# Patient Record
Sex: Female | Born: 1950 | Race: White | Hispanic: No | Smoking: Never smoker
Health system: Southern US, Community
[De-identification: ages and names within clinical notes are randomized; demographics above are authoritative.]

## PROBLEM LIST (undated history)

## (undated) DIAGNOSIS — M25561 Pain in right knee: Secondary | ICD-10-CM

## (undated) DIAGNOSIS — F329 Major depressive disorder, single episode, unspecified: Secondary | ICD-10-CM

## (undated) DIAGNOSIS — E669 Obesity, unspecified: Secondary | ICD-10-CM

## (undated) DIAGNOSIS — F32A Depression, unspecified: Secondary | ICD-10-CM

## (undated) DIAGNOSIS — M25571 Pain in right ankle and joints of right foot: Secondary | ICD-10-CM

## (undated) DIAGNOSIS — I10 Essential (primary) hypertension: Secondary | ICD-10-CM

## (undated) DIAGNOSIS — M1710 Unilateral primary osteoarthritis, unspecified knee: Secondary | ICD-10-CM

## (undated) DIAGNOSIS — N83209 Unspecified ovarian cyst, unspecified side: Secondary | ICD-10-CM

## (undated) HISTORY — DX: Pain in right ankle and joints of right foot: M25.571

## (undated) HISTORY — PX: ABDOMINAL HYSTERECTOMY: SHX81

## (undated) HISTORY — PX: FOREARM SURGERY: SHX651

## (undated) HISTORY — DX: Pain in right knee: M25.561

## (undated) HISTORY — PX: PARTIAL NEPHRECTOMY: SHX414

## (undated) HISTORY — PX: BLADDER REPAIR: SHX76

## (undated) HISTORY — DX: Unspecified ovarian cyst, unspecified side: N83.209

## (undated) HISTORY — PX: DILATION AND CURETTAGE OF UTERUS: SHX78

---

## 1988-11-11 HISTORY — PX: OTHER SURGICAL HISTORY: SHX169

## 2011-09-25 ENCOUNTER — Encounter: Payer: Self-pay | Admitting: *Deleted

## 2011-09-25 ENCOUNTER — Emergency Department (HOSPITAL_COMMUNITY): Payer: No Typology Code available for payment source

## 2011-09-25 ENCOUNTER — Emergency Department (HOSPITAL_COMMUNITY)
Admission: EM | Admit: 2011-09-25 | Discharge: 2011-09-25 | Disposition: A | Discharge status: Home / Self Care | Payer: No Typology Code available for payment source | Attending: Emergency Medicine | Admitting: Emergency Medicine

## 2011-09-25 DIAGNOSIS — M25519 Pain in unspecified shoulder: Secondary | ICD-10-CM | POA: Insufficient documentation

## 2011-09-25 DIAGNOSIS — M7731 Calcaneal spur, right foot: Secondary | ICD-10-CM

## 2011-09-25 DIAGNOSIS — Z7982 Long term (current) use of aspirin: Secondary | ICD-10-CM | POA: Insufficient documentation

## 2011-09-25 DIAGNOSIS — Z79899 Other long term (current) drug therapy: Secondary | ICD-10-CM | POA: Insufficient documentation

## 2011-09-25 DIAGNOSIS — M25473 Effusion, unspecified ankle: Secondary | ICD-10-CM | POA: Insufficient documentation

## 2011-09-25 DIAGNOSIS — M19019 Primary osteoarthritis, unspecified shoulder: Secondary | ICD-10-CM

## 2011-09-25 DIAGNOSIS — F3289 Other specified depressive episodes: Secondary | ICD-10-CM | POA: Insufficient documentation

## 2011-09-25 DIAGNOSIS — M25571 Pain in right ankle and joints of right foot: Secondary | ICD-10-CM

## 2011-09-25 DIAGNOSIS — M25579 Pain in unspecified ankle and joints of unspecified foot: Secondary | ICD-10-CM | POA: Insufficient documentation

## 2011-09-25 DIAGNOSIS — F329 Major depressive disorder, single episode, unspecified: Secondary | ICD-10-CM | POA: Insufficient documentation

## 2011-09-25 DIAGNOSIS — M25476 Effusion, unspecified foot: Secondary | ICD-10-CM | POA: Insufficient documentation

## 2011-09-25 DIAGNOSIS — M7730 Calcaneal spur, unspecified foot: Secondary | ICD-10-CM | POA: Insufficient documentation

## 2011-09-25 DIAGNOSIS — I10 Essential (primary) hypertension: Secondary | ICD-10-CM | POA: Insufficient documentation

## 2011-09-25 HISTORY — DX: Depression, unspecified: F32.A

## 2011-09-25 HISTORY — DX: Essential (primary) hypertension: I10

## 2011-09-25 HISTORY — DX: Obesity, unspecified: E66.9

## 2011-09-25 HISTORY — DX: Major depressive disorder, single episode, unspecified: F32.9

## 2011-09-25 LAB — URINALYSIS, ROUTINE W REFLEX MICROSCOPIC
Glucose, UA: NEGATIVE mg/dL
Leukocytes, UA: NEGATIVE
Specific Gravity, Urine: 1.007 (ref 1.005–1.030)
pH: 7 (ref 5.0–8.0)

## 2011-09-25 LAB — GLUCOSE, CAPILLARY: Glucose-Capillary: 73 mg/dL (ref 70–99)

## 2011-09-25 MED ORDER — HYDROCODONE-ACETAMINOPHEN 5-325 MG PO TABS
1.0000 | ORAL_TABLET | Freq: Once | ORAL | Status: AC
  Administered 2011-09-25: 1 via ORAL
  Filled 2011-09-25: qty 1

## 2011-09-25 MED ORDER — HYDROCODONE-ACETAMINOPHEN 5-325 MG PO TABS: 1.0000 | ORAL_TABLET | Freq: Four times a day (QID) | ORAL | Status: AC | PRN

## 2011-09-25 NOTE — ED Notes (Signed)
Paged ortho to apply cam walker

## 2011-09-25 NOTE — ED Notes (Signed)
When asked if she had a ride home, she said no but that they are on their way here. Pt also asked that she be checked for diabetes and uti.  Paige Dredge, PA made aware of pt's request.

## 2011-09-25 NOTE — ED Provider Notes (Signed)
4:00 PM Patient reports pain in right shoulder and requests pain medication.  I discussed results with her.  Per discussion with Ebbie Ridge, PA-C, plan is for CAM walker for ankle and ortho and PCP follow up.    Patient reports she has been nervous and thinks that is why she is urinating frequently.   Requests cbg and UA.  I have discussed these results with her.  Dillard Cannon [place], [place] 09/25/11 2116

## 2011-09-25 NOTE — Progress Notes (Signed)
Orthopedic Tech Progress Note Patient Details:  Paige Jenkins 23-Jan-1951 [id number]  Other Ortho Devices Type of Ortho Device: CAM walker Ortho Device Location: right foot Ortho Device Interventions: Application   Nikki Dom 09/25/2011, 5:07 PM
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
## 2011-09-25 NOTE — ED Notes (Signed)
Having pain to right shoulder and right ankle, denies injury.

## 2011-09-26 NOTE — ED Provider Notes (Signed)
Medical screening examination/treatment/procedure(s) were performed by non-physician practitioner and as supervising physician I was immediately available for consultation/collaboration.  Doug Sou, MD 09/26/11 1119

## 2011-10-08 ENCOUNTER — Ambulatory Visit: Payer: No Typology Code available for payment source | Attending: Orthopedic Surgery | Admitting: Physical Therapy

## 2011-10-08 DIAGNOSIS — M25519 Pain in unspecified shoulder: Secondary | ICD-10-CM | POA: Insufficient documentation

## 2011-10-08 DIAGNOSIS — M25579 Pain in unspecified ankle and joints of unspecified foot: Secondary | ICD-10-CM | POA: Insufficient documentation

## 2011-10-08 DIAGNOSIS — IMO0001 Reserved for inherently not codable concepts without codable children: Secondary | ICD-10-CM | POA: Insufficient documentation

## 2011-10-08 DIAGNOSIS — M25676 Stiffness of unspecified foot, not elsewhere classified: Secondary | ICD-10-CM | POA: Insufficient documentation

## 2011-10-08 DIAGNOSIS — M25673 Stiffness of unspecified ankle, not elsewhere classified: Secondary | ICD-10-CM | POA: Insufficient documentation

## 2011-10-08 DIAGNOSIS — M25619 Stiffness of unspecified shoulder, not elsewhere classified: Secondary | ICD-10-CM | POA: Insufficient documentation

## 2011-10-10 ENCOUNTER — Ambulatory Visit: Payer: No Typology Code available for payment source | Admitting: Physical Therapy

## 2011-10-15 ENCOUNTER — Ambulatory Visit: Payer: No Typology Code available for payment source | Attending: Orthopedic Surgery | Admitting: Physical Therapy

## 2011-10-15 DIAGNOSIS — M25676 Stiffness of unspecified foot, not elsewhere classified: Secondary | ICD-10-CM | POA: Insufficient documentation

## 2011-10-15 DIAGNOSIS — M25579 Pain in unspecified ankle and joints of unspecified foot: Secondary | ICD-10-CM | POA: Insufficient documentation

## 2011-10-15 DIAGNOSIS — M25519 Pain in unspecified shoulder: Secondary | ICD-10-CM | POA: Insufficient documentation

## 2011-10-15 DIAGNOSIS — IMO0001 Reserved for inherently not codable concepts without codable children: Secondary | ICD-10-CM | POA: Insufficient documentation

## 2011-10-15 DIAGNOSIS — M25673 Stiffness of unspecified ankle, not elsewhere classified: Secondary | ICD-10-CM | POA: Insufficient documentation

## 2011-10-15 DIAGNOSIS — M25619 Stiffness of unspecified shoulder, not elsewhere classified: Secondary | ICD-10-CM | POA: Insufficient documentation

## 2011-10-17 ENCOUNTER — Ambulatory Visit: Payer: No Typology Code available for payment source | Admitting: Physical Therapy

## 2011-10-22 ENCOUNTER — Ambulatory Visit: Payer: No Typology Code available for payment source | Admitting: Physical Therapy

## 2011-10-24 ENCOUNTER — Ambulatory Visit: Payer: No Typology Code available for payment source | Admitting: Physical Therapy

## 2011-10-29 ENCOUNTER — Ambulatory Visit: Payer: No Typology Code available for payment source | Admitting: Physical Therapy

## 2011-10-31 ENCOUNTER — Ambulatory Visit: Payer: No Typology Code available for payment source | Admitting: Physical Therapy

## 2011-11-07 ENCOUNTER — Ambulatory Visit: Payer: No Typology Code available for payment source | Admitting: Physical Therapy

## 2011-11-08 ENCOUNTER — Ambulatory Visit: Payer: No Typology Code available for payment source | Admitting: Physical Therapy

## 2011-11-13 ENCOUNTER — Ambulatory Visit: Payer: No Typology Code available for payment source | Attending: Orthopedic Surgery | Admitting: Physical Therapy

## 2011-11-13 DIAGNOSIS — M25519 Pain in unspecified shoulder: Secondary | ICD-10-CM | POA: Insufficient documentation

## 2011-11-13 DIAGNOSIS — M25619 Stiffness of unspecified shoulder, not elsewhere classified: Secondary | ICD-10-CM | POA: Insufficient documentation

## 2011-11-13 DIAGNOSIS — M25673 Stiffness of unspecified ankle, not elsewhere classified: Secondary | ICD-10-CM | POA: Insufficient documentation

## 2011-11-13 DIAGNOSIS — IMO0001 Reserved for inherently not codable concepts without codable children: Secondary | ICD-10-CM | POA: Insufficient documentation

## 2011-11-13 DIAGNOSIS — M25676 Stiffness of unspecified foot, not elsewhere classified: Secondary | ICD-10-CM | POA: Insufficient documentation

## 2011-11-13 DIAGNOSIS — M25579 Pain in unspecified ankle and joints of unspecified foot: Secondary | ICD-10-CM | POA: Insufficient documentation

## 2011-11-15 ENCOUNTER — Ambulatory Visit: Payer: No Typology Code available for payment source | Admitting: Physical Therapy

## 2011-11-19 ENCOUNTER — Ambulatory Visit: Payer: No Typology Code available for payment source | Admitting: Physical Therapy

## 2011-11-21 ENCOUNTER — Ambulatory Visit: Payer: No Typology Code available for payment source | Admitting: Physical Therapy

## 2011-11-22 ENCOUNTER — Ambulatory Visit: Payer: No Typology Code available for payment source | Admitting: Physical Therapy

## 2011-11-26 ENCOUNTER — Ambulatory Visit: Payer: No Typology Code available for payment source | Admitting: Physical Therapy

## 2011-11-28 ENCOUNTER — Ambulatory Visit: Payer: No Typology Code available for payment source | Admitting: Physical Therapy

## 2011-12-03 ENCOUNTER — Ambulatory Visit: Payer: No Typology Code available for payment source | Admitting: Physical Therapy

## 2011-12-04 ENCOUNTER — Ambulatory Visit: Payer: No Typology Code available for payment source | Admitting: Physical Therapy

## 2011-12-05 ENCOUNTER — Ambulatory Visit: Payer: No Typology Code available for payment source | Admitting: Physical Therapy

## 2011-12-06 ENCOUNTER — Ambulatory Visit: Payer: No Typology Code available for payment source | Admitting: Physical Therapy

## 2011-12-10 ENCOUNTER — Ambulatory Visit: Payer: No Typology Code available for payment source | Admitting: Physical Therapy

## 2011-12-12 ENCOUNTER — Ambulatory Visit: Payer: No Typology Code available for payment source | Admitting: Physical Therapy

## 2011-12-13 ENCOUNTER — Ambulatory Visit: Payer: No Typology Code available for payment source | Attending: Orthopedic Surgery | Admitting: Physical Therapy

## 2011-12-13 DIAGNOSIS — IMO0001 Reserved for inherently not codable concepts without codable children: Secondary | ICD-10-CM | POA: Insufficient documentation

## 2011-12-13 DIAGNOSIS — M25579 Pain in unspecified ankle and joints of unspecified foot: Secondary | ICD-10-CM | POA: Insufficient documentation

## 2011-12-13 DIAGNOSIS — M25676 Stiffness of unspecified foot, not elsewhere classified: Secondary | ICD-10-CM | POA: Insufficient documentation

## 2011-12-13 DIAGNOSIS — M25673 Stiffness of unspecified ankle, not elsewhere classified: Secondary | ICD-10-CM | POA: Insufficient documentation

## 2011-12-13 DIAGNOSIS — M25519 Pain in unspecified shoulder: Secondary | ICD-10-CM | POA: Insufficient documentation

## 2011-12-13 DIAGNOSIS — M25619 Stiffness of unspecified shoulder, not elsewhere classified: Secondary | ICD-10-CM | POA: Insufficient documentation

## 2011-12-17 ENCOUNTER — Ambulatory Visit: Payer: No Typology Code available for payment source | Admitting: Physical Therapy

## 2011-12-19 ENCOUNTER — Ambulatory Visit: Payer: No Typology Code available for payment source | Admitting: Physical Therapy

## 2011-12-20 ENCOUNTER — Ambulatory Visit: Payer: No Typology Code available for payment source | Admitting: Physical Therapy

## 2011-12-24 ENCOUNTER — Ambulatory Visit: Payer: No Typology Code available for payment source | Admitting: Physical Therapy

## 2011-12-25 ENCOUNTER — Ambulatory Visit: Payer: No Typology Code available for payment source | Admitting: Physical Therapy

## 2011-12-26 ENCOUNTER — Ambulatory Visit: Payer: No Typology Code available for payment source | Admitting: Physical Therapy

## 2011-12-27 ENCOUNTER — Ambulatory Visit: Payer: No Typology Code available for payment source | Admitting: Physical Therapy

## 2011-12-31 ENCOUNTER — Ambulatory Visit: Payer: No Typology Code available for payment source | Admitting: Physical Therapy

## 2012-01-02 ENCOUNTER — Ambulatory Visit: Payer: No Typology Code available for payment source | Admitting: Physical Therapy

## 2012-01-07 ENCOUNTER — Ambulatory Visit: Payer: No Typology Code available for payment source | Admitting: Physical Therapy

## 2012-01-08 ENCOUNTER — Ambulatory Visit: Payer: No Typology Code available for payment source | Admitting: Physical Therapy

## 2012-01-10 ENCOUNTER — Ambulatory Visit: Payer: No Typology Code available for payment source | Attending: Orthopedic Surgery | Admitting: Physical Therapy

## 2012-01-10 DIAGNOSIS — M25579 Pain in unspecified ankle and joints of unspecified foot: Secondary | ICD-10-CM | POA: Insufficient documentation

## 2012-01-10 DIAGNOSIS — M25619 Stiffness of unspecified shoulder, not elsewhere classified: Secondary | ICD-10-CM | POA: Insufficient documentation

## 2012-01-10 DIAGNOSIS — IMO0001 Reserved for inherently not codable concepts without codable children: Secondary | ICD-10-CM | POA: Insufficient documentation

## 2012-01-10 DIAGNOSIS — M25673 Stiffness of unspecified ankle, not elsewhere classified: Secondary | ICD-10-CM | POA: Insufficient documentation

## 2012-01-10 DIAGNOSIS — M25676 Stiffness of unspecified foot, not elsewhere classified: Secondary | ICD-10-CM | POA: Insufficient documentation

## 2012-01-10 DIAGNOSIS — M25519 Pain in unspecified shoulder: Secondary | ICD-10-CM | POA: Insufficient documentation

## 2012-04-20 ENCOUNTER — Encounter: Payer: Self-pay | Admitting: Family Medicine

## 2012-04-20 ENCOUNTER — Ambulatory Visit (INDEPENDENT_AMBULATORY_CARE_PROVIDER_SITE_OTHER): Payer: Medicaid Other | Admitting: Family Medicine

## 2012-04-20 VITALS — BP 147/86 | HR 72 | Temp 97.3°F | Ht 66.0 in | Wt 301.1 lb

## 2012-04-20 DIAGNOSIS — R739 Hyperglycemia, unspecified: Secondary | ICD-10-CM

## 2012-04-20 DIAGNOSIS — I10 Essential (primary) hypertension: Secondary | ICD-10-CM

## 2012-04-20 DIAGNOSIS — M25579 Pain in unspecified ankle and joints of unspecified foot: Secondary | ICD-10-CM

## 2012-04-20 DIAGNOSIS — F3289 Other specified depressive episodes: Secondary | ICD-10-CM

## 2012-04-20 DIAGNOSIS — Z0000 Encounter for general adult medical examination without abnormal findings: Secondary | ICD-10-CM

## 2012-04-20 DIAGNOSIS — F32A Depression, unspecified: Secondary | ICD-10-CM

## 2012-04-20 DIAGNOSIS — F329 Major depressive disorder, single episode, unspecified: Secondary | ICD-10-CM

## 2012-04-20 DIAGNOSIS — M25571 Pain in right ankle and joints of right foot: Secondary | ICD-10-CM

## 2012-04-20 DIAGNOSIS — R7309 Other abnormal glucose: Secondary | ICD-10-CM

## 2012-04-20 NOTE — Patient Instructions (Signed)
It was good to meet you today!  We'll do some blood work when you come back in tomorrow. Make a lab appt on your way out.  Come back and see me in 1 month for a blood pressure check.

## 2012-04-21 ENCOUNTER — Other Ambulatory Visit (INDEPENDENT_AMBULATORY_CARE_PROVIDER_SITE_OTHER): Payer: Medicaid Other

## 2012-04-21 ENCOUNTER — Other Ambulatory Visit: Payer: Self-pay | Admitting: Family Medicine

## 2012-04-21 DIAGNOSIS — R7309 Other abnormal glucose: Secondary | ICD-10-CM

## 2012-04-21 DIAGNOSIS — R739 Hyperglycemia, unspecified: Secondary | ICD-10-CM

## 2012-04-21 DIAGNOSIS — Z1231 Encounter for screening mammogram for malignant neoplasm of breast: Secondary | ICD-10-CM

## 2012-04-21 DIAGNOSIS — I10 Essential (primary) hypertension: Secondary | ICD-10-CM

## 2012-04-21 LAB — LIPID PANEL
Cholesterol: 192 mg/dL (ref 0–200)
Total CHOL/HDL Ratio: 4.2 Ratio
VLDL: 26 mg/dL (ref 0–40)

## 2012-04-21 LAB — COMPREHENSIVE METABOLIC PANEL
AST: 13 U/L (ref 0–37)
Albumin: 3.8 g/dL (ref 3.5–5.2)
Alkaline Phosphatase: 77 U/L (ref 39–117)
Potassium: 4.6 mEq/L (ref 3.5–5.3)
Sodium: 141 mEq/L (ref 135–145)
Total Bilirubin: 0.3 mg/dL (ref 0.3–1.2)
Total Protein: 6.6 g/dL (ref 6.0–8.3)

## 2012-04-21 LAB — CBC WITH DIFFERENTIAL/PLATELET
Basophils Relative: 0 % (ref 0–1)
Eosinophils Absolute: 0.1 10*3/uL (ref 0.0–0.7)
Eosinophils Relative: 1 % (ref 0–5)
MCH: 28.2 pg (ref 26.0–34.0)
MCHC: 33.2 g/dL (ref 30.0–36.0)
MCV: 84.8 fL (ref 78.0–100.0)
Monocytes Relative: 8 % (ref 3–12)
Neutrophils Relative %: 65 % (ref 43–77)
Platelets: 297 10*3/uL (ref 150–400)

## 2012-04-21 LAB — TSH: TSH: 2.318 u[IU]/mL (ref 0.350–4.500)

## 2012-04-21 LAB — POCT GLYCOSYLATED HEMOGLOBIN (HGB A1C): Hemoglobin A1C: 5.9

## 2012-04-23 DIAGNOSIS — I10 Essential (primary) hypertension: Secondary | ICD-10-CM | POA: Insufficient documentation

## 2012-04-23 DIAGNOSIS — M25572 Pain in left ankle and joints of left foot: Secondary | ICD-10-CM | POA: Insufficient documentation

## 2012-04-23 DIAGNOSIS — F32A Depression, unspecified: Secondary | ICD-10-CM | POA: Insufficient documentation

## 2012-04-23 DIAGNOSIS — Z0000 Encounter for general adult medical examination without abnormal findings: Secondary | ICD-10-CM | POA: Insufficient documentation

## 2012-04-23 DIAGNOSIS — F329 Major depressive disorder, single episode, unspecified: Secondary | ICD-10-CM | POA: Insufficient documentation

## 2012-04-23 NOTE — Assessment & Plan Note (Signed)
Chronic Cont physical therapy
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
## 2012-04-23 NOTE — Assessment & Plan Note (Signed)
Will discuss next visit, FU in 2 weeks

## 2012-04-23 NOTE — Assessment & Plan Note (Signed)
Followed by psychiatrist.   Controlled currently with Trazodone and Celexa.

## 2012-04-23 NOTE — Assessment & Plan Note (Signed)
Elevated BP today in clinic, not at goal. Awaiting records as to why she's on Beta blocker rather than diuretic or ACE. FU in 2 weeks for BP check, 2 days for lab check

## 2012-05-07 ENCOUNTER — Ambulatory Visit
Admission: RE | Admit: 2012-05-07 | Discharge: 2012-05-07 | Disposition: A | Discharge status: Home / Self Care | Payer: Medicaid Other | Source: Ambulatory Visit | Attending: Family Medicine | Admitting: Family Medicine

## 2012-05-07 DIAGNOSIS — Z1231 Encounter for screening mammogram for malignant neoplasm of breast: Secondary | ICD-10-CM

## 2012-05-20 ENCOUNTER — Encounter: Payer: Self-pay | Admitting: Family Medicine

## 2012-05-20 ENCOUNTER — Ambulatory Visit (INDEPENDENT_AMBULATORY_CARE_PROVIDER_SITE_OTHER): Payer: Medicaid Other | Admitting: Family Medicine

## 2012-05-20 VITALS — BP 133/83 | HR 71 | Temp 98.1°F | Ht 66.0 in | Wt 302.0 lb

## 2012-05-20 DIAGNOSIS — N83209 Unspecified ovarian cyst, unspecified side: Secondary | ICD-10-CM | POA: Insufficient documentation

## 2012-05-20 DIAGNOSIS — I10 Essential (primary) hypertension: Secondary | ICD-10-CM

## 2012-05-20 DIAGNOSIS — Z8041 Family history of malignant neoplasm of ovary: Secondary | ICD-10-CM

## 2012-05-20 NOTE — Assessment & Plan Note (Signed)
At goal No changes at this time.   FU in 6 months

## 2012-05-20 NOTE — Patient Instructions (Addendum)
It was good to see you again today!  We will send you the results of the lab test.  No changes to your medication today.

## 2012-05-20 NOTE — Progress Notes (Signed)
  Subjective:    Patient ID: Paige Jenkins, female    DOB: September 26, 1951, 61 y.o.   MRN: [id number]  HPI 1.  Hypertension:  Long-term problem for this patient.  No adverse effects from medication.  Not checking it regularly.  No HA, CP, dizziness, shortness of breath, palpitations, or LE swelling.   BP Readings from Last 3 Encounters:  05/20/12 133/83  04/20/12 147/86  09/25/11 130/83    2.  Ovarian cyst:  History of ovarian cyst, discovered while she was in [place] via ultrasound.  Followed by her mother's previous oncologist (mother died from ovarian cancer) and she had yearly CA-125's at that time.  Always have been WNL.  Requesting another CA-125, last was 2 years ago before she moved.  No abdominal or pelvic pain, no abdominal bloating or increasing girth.     Review of Systems See HPI above for review of systems.       Objective:   Physical Exam  Gen:  Alert, cooperative patient who appears stated age in no acute distress.  Vital signs reviewed. HEENT:  [place]/AT, MMM Cardiac:  Regular rate and rhythm without murmur auscultated.  Good S1/S2. Pulm:  Clear to auscultation bilaterally with good air movement.  No wheezes or rales noted.   Abdomen:  Obese, NT, ND      Assessment & Plan:

## 2012-05-20 NOTE — Assessment & Plan Note (Signed)
Will repeat CA-125 at this time.

## 2012-05-21 ENCOUNTER — Encounter: Payer: Self-pay | Admitting: Family Medicine

## 2012-05-21 LAB — CA 125: CA 125: 6.6 U/mL (ref 0.0–30.2)

## 2012-07-30 ENCOUNTER — Encounter: Payer: Self-pay | Admitting: Family Medicine

## 2012-07-30 ENCOUNTER — Ambulatory Visit (INDEPENDENT_AMBULATORY_CARE_PROVIDER_SITE_OTHER): Payer: Medicaid Other | Admitting: Family Medicine

## 2012-07-30 VITALS — BP 130/86 | HR 76 | Temp 97.8°F | Ht 66.0 in | Wt 300.0 lb

## 2012-07-30 DIAGNOSIS — I10 Essential (primary) hypertension: Secondary | ICD-10-CM

## 2012-07-30 MED ORDER — DICLOFENAC SODIUM 75 MG PO TBEC: 75.0000 mg | DELAYED_RELEASE_TABLET | Freq: Two times a day (BID) | ORAL | Status: DC

## 2012-07-30 MED ORDER — METOPROLOL SUCCINATE ER 25 MG PO TB24: 25.0000 mg | ORAL_TABLET | Freq: Every day | ORAL | Status: DC

## 2012-07-30 NOTE — Progress Notes (Signed)
  Subjective:    Patient ID: Paige Jenkins, female    DOB: 11/23/1950, 61 y.o.   MRN: [id number]  HPI  1.  Hypertension:  Patient states that every time she goes to her psychiatrist her blood pressure is 160 - 170 systolic.  Here it has been well controlled.  Wanted to have it checked again today.    Not checking it regularly.  No HA, CP, dizziness, shortness of breath, palpitations, or LE swelling.   BP Readings from Last 3 Encounters:  07/30/12 130/86  05/20/12 133/83  04/20/12 147/86   2.  Medication refill:  Patient believed that she had to come in for medication refills.  However I discussed that she can just call her pharmacy for refills.    Review of Systems See HPI above for review of systems.       Objective:   Physical Exam Gen:  Alert, cooperative patient who appears stated age in no acute distress.  Vital signs reviewed. Cardiac:  Regular rate and rhythm without murmur auscultated.  Good S1/S2. Pulm:  Clear to auscultation bilaterally with good air movement.  No wheezes or rales noted.          Assessment & Plan:

## 2012-07-30 NOTE — Patient Instructions (Addendum)
The name of the pediatrician is [facility]. We are also happy to see children of all ages.    I have sent in the refills for the medications.

## 2012-07-30 NOTE — Assessment & Plan Note (Signed)
At goal today. DIscussed with patient she can make nurse appt in future if she wants her blood pressure checked. Also told her to call pharmacy for med refills.

## 2013-02-11 ENCOUNTER — Other Ambulatory Visit: Payer: Self-pay | Admitting: Family Medicine

## 2013-05-06 ENCOUNTER — Other Ambulatory Visit: Payer: Self-pay | Admitting: Family Medicine

## 2013-05-07 ENCOUNTER — Other Ambulatory Visit: Payer: Self-pay | Admitting: *Deleted

## 2013-05-07 MED ORDER — DICLOFENAC SODIUM 75 MG PO TBEC: DELAYED_RELEASE_TABLET | ORAL | Status: DC

## 2013-05-10 ENCOUNTER — Ambulatory Visit (HOSPITAL_COMMUNITY)
Admission: RE | Admit: 2013-05-10 | Discharge: 2013-05-10 | Disposition: A | Discharge status: Home / Self Care | Payer: Medicaid Other | Source: Ambulatory Visit | Attending: Family Medicine | Admitting: Family Medicine

## 2013-05-10 ENCOUNTER — Ambulatory Visit (INDEPENDENT_AMBULATORY_CARE_PROVIDER_SITE_OTHER): Payer: Medicaid Other | Admitting: Family Medicine

## 2013-05-10 ENCOUNTER — Encounter: Payer: Self-pay | Admitting: Family Medicine

## 2013-05-10 VITALS — BP 142/82 | Ht 66.75 in | Wt 284.0 lb

## 2013-05-10 DIAGNOSIS — R634 Abnormal weight loss: Secondary | ICD-10-CM

## 2013-05-10 DIAGNOSIS — Z79899 Other long term (current) drug therapy: Secondary | ICD-10-CM | POA: Insufficient documentation

## 2013-05-10 DIAGNOSIS — E669 Obesity, unspecified: Secondary | ICD-10-CM

## 2013-05-10 DIAGNOSIS — Z0000 Encounter for general adult medical examination without abnormal findings: Secondary | ICD-10-CM

## 2013-05-10 DIAGNOSIS — R002 Palpitations: Secondary | ICD-10-CM

## 2013-05-10 DIAGNOSIS — I10 Essential (primary) hypertension: Secondary | ICD-10-CM | POA: Insufficient documentation

## 2013-05-10 LAB — CBC
HCT: 41.3 % (ref 36.0–46.0)
Hemoglobin: 13.4 g/dL (ref 12.0–15.0)
MCH: 28.5 pg (ref 26.0–34.0)
MCV: 87.7 fL (ref 78.0–100.0)
Platelets: 286 10*3/uL (ref 150–400)
RBC: 4.71 MIL/uL (ref 3.87–5.11)
WBC: 7.3 10*3/uL (ref 4.0–10.5)

## 2013-05-10 MED ORDER — TETANUS-DIPHTH-ACELL PERTUSSIS 5-2.5-18.5 LF-MCG/0.5 IM SUSP: 0.5000 mL | Freq: Once | INTRAMUSCULAR | Status: DC

## 2013-05-10 NOTE — Patient Instructions (Addendum)
1. Keep taking your metoprolol. Your blood pressure was elevated today slightly . Keep a log at home of your blood pressure and bring it in to a visit in 2 weeks. You are welcome to see me at that time.   2. For your palpitations, we did an EKG today (looked fine) . I am gad you have had a normal stress test in the past. See me in 2 weeks so we can see if this is doing better. I want you to hold off on the coffee during this time to see fi that helps.    3. We are going to check your thyroid function, kidney, and liver function today as well as your blood counts. i will call you if we need to adjust any medications. We are also going to check you for diabetes.    Health Maintenance Due  Topic Date Due  . Pap Smear -you need a well woman exam for breast exam and pap smear.  09/17/1971  . Tetanus/tdap -you can have this today if you would like.  09/16/1972  . Colonoscopy -please call your previous doctor and find the date of your last colonoscopy. i do not have access to your records today.  09/17/2003   Thanks, Dr. Durene Cal

## 2013-05-10 NOTE — Progress Notes (Signed)
Subjective:  Patient states she is here for yearly physical but has multiple complaints as below. Unable to complete well woman exam at thist ime including pap and breast smear and encouraged patient to follow up for these.   # Hypertension BP Readings from Last 3 Encounters:  05/10/13 142/82  07/30/12 130/86  05/20/12 133/83  Home BP monitoring-no Compliant with medications-yes without side effects, metoprolol 25mg  ER daily.  Denies any CP, HA, SOB, blurry vision, LE edema, transient weakness, orthopnea, PND.   # Palpitations ROS as noted above with no chest pain or shortness of breath. Addition of no lightheadedness. Patient states started 3-4 weeks ago. Lasts less than a minute when it happens. Happens 1-2x per day. States had a stress test within the last 2 years before she moved here and reports it was unremarkable (cannot find in records). Patient did start drinking coffee in the morning 3-4 weeks ago and does states palpitations are concentrated typically in the morning and afternoon.   # Obesity/Weight loss Intentional as has been going to the gym and eating better (states 40 lbs over last 6 months per gym but our records show down about 18 lbs). Along with palpitations above does describe being a little more jittery but denies tremors/anxiety. Denies constipation/diarrhea/hot or cold intolerance/changes in hairs or nails. Daughter with history hyperthyroidism. Patient is concerned about thyroid function.   ROS--See HPI  Past Medical History-hypertension, nonsmoker, depression.  Reviewed problem list.  Medications- reviewed and updated Chief complaint-noted  Objective: BP 158/84  Ht 5' 6.75" (1.695 m)  Wt 284 lb (128.822 kg)  BMI 44.84 kg/m2 Gen: NAD, resting comfortably, obese HEENT: no thyromegaly, no thinning hair CV: RRR no murmurs rubs or gallops Lungs: CTAB no crackles, wheeze, rhonchi Skin: warm, dry Neuro: grossly normal, moves all extremities Ext: no pitting  edema  ECG REPORT  Rate: 65  Rhythm: normal sinus rhythm  Axis: normal  Intervals:no abnormalities  ST&T Change: none, no q waves   Narrative Interpretation: normal EKG, normal sinus rhythm, there are no previous tracings available for comparison    Assessment/Plan:  # Palpitations EKG unremarkable. TSH ordered. REportedly with normal stress test in last 2 years but records not available. Possibly related to coffee intake. Will have patient abstain for next 2 weeks and follow up at that time.  # Obesity/Weight loss Praised weight loss efforts. Due to concern over thyroid, will check TSH especially with palpitations. If does not improve, could consider 24 hour holter monitor since these are occuring daily. Patient with concern of DM and with obesity will check a1c as well.

## 2013-05-11 ENCOUNTER — Encounter: Payer: Self-pay | Admitting: Family Medicine

## 2013-05-11 LAB — LIPID PANEL
Cholesterol: 189 mg/dL (ref 0–200)
Total CHOL/HDL Ratio: 3.9 Ratio

## 2013-05-11 LAB — TSH: TSH: 1.573 u[IU]/mL (ref 0.350–4.500)

## 2013-05-11 LAB — COMPREHENSIVE METABOLIC PANEL
Albumin: 4 g/dL (ref 3.5–5.2)
Alkaline Phosphatase: 70 U/L (ref 39–117)
BUN: 14 mg/dL (ref 6–23)
Glucose, Bld: 89 mg/dL (ref 70–99)
Total Bilirubin: 0.3 mg/dL (ref 0.3–1.2)

## 2013-05-11 NOTE — Assessment & Plan Note (Signed)
Systolic BP mildly elevated today but previously well controlled. WIll follow up in 2 weeks and consider increase in medication vs. Continued monitoring as patient makes lifestyle changes. 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 Patient is fasting.

## 2013-05-11 NOTE — Assessment & Plan Note (Signed)
TDAP today, given handout for colonoscopy. To return for pap smear.

## 2013-05-17 ENCOUNTER — Encounter: Payer: Self-pay | Admitting: Family Medicine

## 2013-08-11 ENCOUNTER — Other Ambulatory Visit: Payer: Self-pay | Admitting: Family Medicine

## 2013-09-13 ENCOUNTER — Other Ambulatory Visit: Payer: Self-pay | Admitting: Family Medicine

## 2013-10-13 ENCOUNTER — Telehealth: Payer: Self-pay | Admitting: Family Medicine

## 2013-10-13 NOTE — Telephone Encounter (Signed)
Pt is requesting a refill on celexa. She does not get this from [place]. She gets this from her psychiatrist. I told her that I wasn't sure if this can be refilled through [place]. jw

## 2013-10-13 NOTE — Telephone Encounter (Signed)
Attempted to call patient

## 2013-10-14 NOTE — Telephone Encounter (Signed)
LVM for patient to call back. Unfortunately we will not be able to fill it being that she is getting it from another doctor already. We are not treating her for this issue and she needs to get through that doctor

## 2013-10-15 NOTE — Telephone Encounter (Signed)
Spoke with Dr. Gwendolyn Grant and he is fine with writing this rx if it she cannot get it from her psychiatrist first

## 2013-10-19 ENCOUNTER — Encounter: Payer: Self-pay | Admitting: Family Medicine

## 2013-10-19 ENCOUNTER — Ambulatory Visit (INDEPENDENT_AMBULATORY_CARE_PROVIDER_SITE_OTHER): Payer: Medicaid Other | Admitting: Family Medicine

## 2013-10-19 VITALS — BP 132/78 | HR 64 | Temp 97.9°F | Ht 66.0 in | Wt 276.2 lb

## 2013-10-19 DIAGNOSIS — M76899 Other specified enthesopathies of unspecified lower limb, excluding foot: Secondary | ICD-10-CM

## 2013-10-19 DIAGNOSIS — M7061 Trochanteric bursitis, right hip: Secondary | ICD-10-CM

## 2013-10-19 DIAGNOSIS — M25579 Pain in unspecified ankle and joints of unspecified foot: Secondary | ICD-10-CM

## 2013-10-19 DIAGNOSIS — M25561 Pain in right knee: Secondary | ICD-10-CM

## 2013-10-19 DIAGNOSIS — I10 Essential (primary) hypertension: Secondary | ICD-10-CM

## 2013-10-19 DIAGNOSIS — M25571 Pain in right ankle and joints of right foot: Secondary | ICD-10-CM

## 2013-10-19 DIAGNOSIS — M25569 Pain in unspecified knee: Secondary | ICD-10-CM

## 2013-10-19 MED ORDER — TERBINAFINE HCL 250 MG PO TABS: 250.0000 mg | ORAL_TABLET | Freq: Every day | ORAL | Status: DC
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
## 2013-10-20 DIAGNOSIS — M25561 Pain in right knee: Secondary | ICD-10-CM | POA: Insufficient documentation

## 2013-10-20 DIAGNOSIS — M7061 Trochanteric bursitis, right hip: Secondary | ICD-10-CM | POA: Insufficient documentation

## 2013-10-20 NOTE — Assessment & Plan Note (Signed)
Wouldlike a repeat corticosteroid injection, which has helped in past. Declines today, but will make an appt to return for this.   Again, greatly limits ambulation. To continue with weight loss plan.  Will prescribe rolling walker with seat to help with prolonged walking as well as wheelchair.

## 2013-10-20 NOTE — Assessment & Plan Note (Signed)
Persists.  Limits her activity.

## 2013-10-20 NOTE — Assessment & Plan Note (Signed)
Continue oral analgeics Limits activity

## 2013-10-20 NOTE — Assessment & Plan Note (Signed)
Right at goal today.   Continue with lifestyle changes. If persists, increase antihypertensives.

## 2013-11-15 ENCOUNTER — Telehealth: Payer: Self-pay | Admitting: *Deleted

## 2013-11-15 NOTE — Telephone Encounter (Signed)
Patient called to let us know she is changing her pharmacy from [facility] [place]. I changed it in Epic. She says the pharmacy will send a refill request also today on her metoprolol and diclofencac.Paige Jenkins, Paige Jenkins

## 2013-11-15 NOTE — Telephone Encounter (Signed)
Needs refill on Metoprolol 25mg  #90 and diclofenac. Fleeger, Paige RochesterJessica Jenkins

## 2013-11-16 MED ORDER — METOPROLOL SUCCINATE ER 25 MG PO TB24: ORAL_TABLET | ORAL | Status: DC

## 2013-11-16 MED ORDER — DICLOFENAC SODIUM 75 MG PO TBEC: DELAYED_RELEASE_TABLET | ORAL | Status: DC

## 2013-11-16 NOTE — Telephone Encounter (Signed)
Sent electronically 

## 2013-12-13 ENCOUNTER — Other Ambulatory Visit: Payer: Self-pay | Admitting: Family Medicine

## 2014-01-12 ENCOUNTER — Other Ambulatory Visit: Payer: Self-pay | Admitting: Family Medicine

## 2014-02-08 ENCOUNTER — Other Ambulatory Visit: Payer: Self-pay | Admitting: Family Medicine

## 2014-02-08 NOTE — Telephone Encounter (Signed)
Pt needs refill on lamisil.  Her fungus toe has not healed. [facility] has requested a refill on lamisil and voltran. She would like to talk about the toe fungus.

## 2014-03-10 ENCOUNTER — Other Ambulatory Visit: Payer: Self-pay | Admitting: Family Medicine

## 2014-04-05 DIAGNOSIS — J3489 Other specified disorders of nose and nasal sinuses: Secondary | ICD-10-CM | POA: Diagnosis not present

## 2014-04-05 DIAGNOSIS — L259 Unspecified contact dermatitis, unspecified cause: Secondary | ICD-10-CM | POA: Diagnosis not present

## 2014-04-05 DIAGNOSIS — J069 Acute upper respiratory infection, unspecified: Secondary | ICD-10-CM | POA: Diagnosis not present

## 2014-04-05 DIAGNOSIS — J329 Chronic sinusitis, unspecified: Secondary | ICD-10-CM | POA: Diagnosis not present

## 2014-04-05 DIAGNOSIS — I10 Essential (primary) hypertension: Secondary | ICD-10-CM | POA: Diagnosis not present

## 2014-04-05 DIAGNOSIS — R0789 Other chest pain: Secondary | ICD-10-CM | POA: Diagnosis not present

## 2014-04-05 DIAGNOSIS — Z6841 Body Mass Index (BMI) 40.0 and over, adult: Secondary | ICD-10-CM | POA: Diagnosis not present

## 2014-04-05 DIAGNOSIS — E785 Hyperlipidemia, unspecified: Secondary | ICD-10-CM | POA: Diagnosis not present

## 2014-04-05 DIAGNOSIS — I200 Unstable angina: Secondary | ICD-10-CM | POA: Diagnosis not present

## 2014-04-05 DIAGNOSIS — E119 Type 2 diabetes mellitus without complications: Secondary | ICD-10-CM | POA: Diagnosis not present

## 2014-04-05 DIAGNOSIS — Z780 Asymptomatic menopausal state: Secondary | ICD-10-CM | POA: Diagnosis not present

## 2014-04-05 DIAGNOSIS — Z79899 Other long term (current) drug therapy: Secondary | ICD-10-CM | POA: Diagnosis not present

## 2014-04-05 DIAGNOSIS — R079 Chest pain, unspecified: Secondary | ICD-10-CM | POA: Diagnosis not present

## 2014-04-05 DIAGNOSIS — F329 Major depressive disorder, single episode, unspecified: Secondary | ICD-10-CM | POA: Diagnosis not present

## 2014-04-05 DIAGNOSIS — J40 Bronchitis, not specified as acute or chronic: Secondary | ICD-10-CM | POA: Diagnosis not present

## 2014-04-05 DIAGNOSIS — K2970 Gastritis, unspecified, without bleeding: Secondary | ICD-10-CM | POA: Diagnosis not present

## 2014-04-05 DIAGNOSIS — F3289 Other specified depressive episodes: Secondary | ICD-10-CM | POA: Diagnosis not present

## 2014-04-06 DIAGNOSIS — K2970 Gastritis, unspecified, without bleeding: Secondary | ICD-10-CM | POA: Diagnosis not present

## 2014-04-06 DIAGNOSIS — I10 Essential (primary) hypertension: Secondary | ICD-10-CM | POA: Diagnosis not present

## 2014-04-06 DIAGNOSIS — R079 Chest pain, unspecified: Secondary | ICD-10-CM | POA: Diagnosis not present

## 2014-04-06 DIAGNOSIS — K2990 Gastroduodenitis, unspecified, without bleeding: Secondary | ICD-10-CM | POA: Diagnosis not present

## 2014-04-06 DIAGNOSIS — E669 Obesity, unspecified: Secondary | ICD-10-CM | POA: Diagnosis not present

## 2014-05-17 ENCOUNTER — Other Ambulatory Visit: Payer: Self-pay | Admitting: Family Medicine

## 2014-05-17 MED ORDER — TERBINAFINE HCL 250 MG PO TABS: 250.0000 mg | ORAL_TABLET | Freq: Every day | ORAL | Status: DC

## 2014-05-17 MED ORDER — DICLOFENAC SODIUM 75 MG PO TBEC: 75.0000 mg | DELAYED_RELEASE_TABLET | Freq: Two times a day (BID) | ORAL | Status: DC

## 2014-05-17 MED ORDER — DICLOFENAC SODIUM 75 MG PO TBEC: DELAYED_RELEASE_TABLET | ORAL | Status: DC

## 2014-06-08 ENCOUNTER — Telehealth: Payer: Self-pay | Admitting: Family Medicine

## 2014-06-08 MED ORDER — METOPROLOL SUCCINATE ER 25 MG PO TB24: ORAL_TABLET | ORAL | Status: DC
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
## 2014-06-10 ENCOUNTER — Encounter: Payer: Self-pay | Admitting: Family Medicine

## 2014-06-10 ENCOUNTER — Ambulatory Visit (INDEPENDENT_AMBULATORY_CARE_PROVIDER_SITE_OTHER): Payer: Medicare Other | Admitting: Family Medicine

## 2014-06-10 VITALS — BP 142/98 | HR 60 | Temp 98.4°F | Ht 66.0 in | Wt 283.0 lb

## 2014-06-10 DIAGNOSIS — T148XXA Other injury of unspecified body region, initial encounter: Secondary | ICD-10-CM | POA: Diagnosis not present

## 2014-06-10 MED ORDER — ZOSTER VACCINE LIVE 19400 UNT/0.65ML ~~LOC~~ SOLR: 0.6500 mL | Freq: Once | SUBCUTANEOUS | Status: DC

## 2014-06-10 NOTE — Progress Notes (Signed)
Patient ID: Paige Jenkins, female   DOB: 02/26/1951, 63 y.o.   MRN: [id number]   [facility] Paige FerrettiMelanie C Kacey Dysert, MD Phone: [phone]  Subjective:  Ms Paige Jenkins is a 63 y.o F who presents for SDA # Bump on forehead -red spot on the forehead that was raised, burning sensation -did not itch, pt CC nurse previously, thought it was possibly zoster -no other spots  -has not tried anythign for it -did see psych earlier today for anxiety   All relevant systems were reviewed and were negative unless otherwise noted in the HPI  Past Medical History Patient Active Problem List   Diagnosis Date Noted  . Trochanteric bursitis of right hip 10/20/2013  . Right knee pain 10/20/2013  . Ovarian cyst 05/20/2012  . Right ankle pain 04/23/2012  . Hypertension 04/23/2012  . Depression 04/23/2012  . Preventative health care 04/23/2012   Reviewed problem list.  Medications- reviewed and updated Chief complaint-noted No additions to family history Social history- patient is a never smoker  Objective: BP 142/98  Pulse 60  Temp(Src) 98.4 F (36.9 C) (Oral)  Ht 5\' 6"  (1.676 m)  Wt 283 lb (128.368 kg)  BMI 45.70 kg/m2 Gen: NAD, alert, cooperative with exam HEENT: NCAT, EOMI, PERRL Neck: FROM, supple, no lymphadenopathy Neuro: Alert and oriented, CNii-xii intact Skin: sml egg-like bump on forehead  Assessment/Plan: See problem based a/p
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
## 2014-08-30 ENCOUNTER — Ambulatory Visit: Payer: Medicare Other | Admitting: Family Medicine

## 2014-09-13 DIAGNOSIS — B351 Tinea unguium: Secondary | ICD-10-CM | POA: Diagnosis not present

## 2014-09-13 DIAGNOSIS — M79609 Pain in unspecified limb: Secondary | ICD-10-CM | POA: Diagnosis not present

## 2014-09-13 DIAGNOSIS — M2570 Osteophyte, unspecified joint: Secondary | ICD-10-CM | POA: Diagnosis not present

## 2014-09-16 ENCOUNTER — Ambulatory Visit: Payer: Medicare Other | Admitting: Family Medicine

## 2014-10-11 ENCOUNTER — Ambulatory Visit: Payer: Medicare Other | Admitting: Family Medicine

## 2014-10-11 DIAGNOSIS — B351 Tinea unguium: Secondary | ICD-10-CM | POA: Diagnosis not present

## 2014-10-11 DIAGNOSIS — M79609 Pain in unspecified limb: Secondary | ICD-10-CM | POA: Diagnosis not present

## 2014-11-01 ENCOUNTER — Ambulatory Visit (INDEPENDENT_AMBULATORY_CARE_PROVIDER_SITE_OTHER): Payer: Medicare Other | Admitting: Family Medicine

## 2014-11-01 ENCOUNTER — Telehealth: Payer: Self-pay | Admitting: *Deleted

## 2014-11-01 ENCOUNTER — Encounter: Payer: Self-pay | Admitting: Family Medicine

## 2014-11-01 VITALS — BP 171/81 | HR 90 | Temp 97.8°F | Ht 66.0 in | Wt 283.1 lb

## 2014-11-01 DIAGNOSIS — I10 Essential (primary) hypertension: Secondary | ICD-10-CM | POA: Diagnosis not present

## 2014-11-01 DIAGNOSIS — M25561 Pain in right knee: Secondary | ICD-10-CM

## 2014-11-01 DIAGNOSIS — N393 Stress incontinence (female) (male): Secondary | ICD-10-CM | POA: Insufficient documentation

## 2014-11-01 DIAGNOSIS — F32A Depression, unspecified: Secondary | ICD-10-CM

## 2014-11-01 DIAGNOSIS — F329 Major depressive disorder, single episode, unspecified: Secondary | ICD-10-CM | POA: Diagnosis not present

## 2014-11-01 MED ORDER — CITALOPRAM HYDROBROMIDE 40 MG PO TABS: 80.0000 mg | ORAL_TABLET | Freq: Every day | ORAL | Status: DC

## 2014-11-01 MED ORDER — ZOSTER VACCINE LIVE 19400 UNT/0.65ML ~~LOC~~ SOLR: 0.6500 mL | Freq: Once | SUBCUTANEOUS | Status: DC

## 2014-11-01 MED ORDER — TRAMADOL HCL 50 MG PO TABS: 50.0000 mg | ORAL_TABLET | Freq: Three times a day (TID) | ORAL | Status: DC | PRN
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
## 2014-11-01 NOTE — Telephone Encounter (Signed)
Patient calls stating she forgot to have MD sign paperwork for her transportation at her visit today. Patient states she goes back to [place] this evening and would like it to be completed by then. Form given to Huntley DecSara.

## 2014-11-01 NOTE — Assessment & Plan Note (Signed)
Discussed secondary to weight.  May need repeat surgery. She would like to try conservative therapy first.  Kegel exercises to start today.  Weight loss Patient identifies several means of weight loss.

## 2014-11-01 NOTE — Telephone Encounter (Signed)
COmpleted and given to [facility].  Patient returned back this PM asking for paperwork.  I completed my part and gave to [facility] to give to patient.

## 2014-11-01 NOTE — Assessment & Plan Note (Signed)
[facility].  Needs refill for 80 mg Celexa, she has been on this for years.  VERY high dose Celexa.  Warned her about this.  States psychiatrist usually fills this, but she won't see him until Feb.  Would like refills until then.   No SI/HI today.  States she is "doing well" and not experiencing worse symptoms despite difficulties with her daughter.

## 2014-11-01 NOTE — Patient Instructions (Signed)
I think you're on the right track for your incontinence.    You are doing a great job being strong.  Refill for Celexa for 2 months.  Ultram for when your hip and knee pain are bad.  It was good to see you again today. Have a great New Year

## 2014-11-01 NOTE — Assessment & Plan Note (Signed)
Declined steroid shot again today. Attempt Ultram for relief alternating with diclofenac.

## 2014-11-01 NOTE — Assessment & Plan Note (Signed)
Elevated today. Has NOT taken medications today.  To take and return in ~2 weeks for BP recheck.

## 2014-11-02 ENCOUNTER — Telehealth: Payer: Self-pay | Admitting: Family Medicine

## 2014-11-07 ENCOUNTER — Telehealth: Payer: Self-pay | Admitting: *Deleted

## 2014-11-07 NOTE — Telephone Encounter (Signed)
LMOVM for pt to return call.  Received call from corporate compliance RE: patient no longer wanting PHI released to [facility], Herbert DeanerJimmy Brune, or [employer]ennifer Knapp.  Pt will need to come sign previous form to cancel authorization and complete new form.  Ree ShayJackie Warner, ASA was one to get initial call from pt, she has documented in pt permanent comments and she will inform pt of above when she calls back. [place]eger, Maryjo RochesterJessica Dawn

## 2014-11-07 NOTE — Telephone Encounter (Signed)
Pt called back and wanted to inform us that she will be in on November 14 2014, to sign and update her forms. jw

## 2014-11-16 ENCOUNTER — Telehealth: Payer: Self-pay | Admitting: Family Medicine
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
## 2014-11-18 NOTE — Telephone Encounter (Signed)
I have not received a fax from Paige Jenkins.  I will call her on Monday to discuss the situation with her daughter.  However if her daughter refuses admission, she cannot force this on her as she's not a minor.

## 2014-11-19 DIAGNOSIS — F319 Bipolar disorder, unspecified: Secondary | ICD-10-CM | POA: Diagnosis not present

## 2014-11-20 DIAGNOSIS — F319 Bipolar disorder, unspecified: Secondary | ICD-10-CM | POA: Diagnosis not present

## 2014-11-21 DIAGNOSIS — F319 Bipolar disorder, unspecified: Secondary | ICD-10-CM | POA: Diagnosis not present

## 2014-11-22 DIAGNOSIS — F319 Bipolar disorder, unspecified: Secondary | ICD-10-CM | POA: Diagnosis not present

## 2014-11-25 DIAGNOSIS — F319 Bipolar disorder, unspecified: Secondary | ICD-10-CM | POA: Diagnosis not present

## 2014-11-25 NOTE — Telephone Encounter (Signed)
Have not been able to get through.  Will you guys try calling?  STill have not received fax from her.

## 2014-11-26 DIAGNOSIS — F319 Bipolar disorder, unspecified: Secondary | ICD-10-CM | POA: Diagnosis not present

## 2014-11-28 DIAGNOSIS — F319 Bipolar disorder, unspecified: Secondary | ICD-10-CM | POA: Diagnosis not present

## 2014-11-28 NOTE — Telephone Encounter (Signed)
LVM for patient to call back to inform her of below 

## 2014-11-29 DIAGNOSIS — F319 Bipolar disorder, unspecified: Secondary | ICD-10-CM | POA: Diagnosis not present

## 2014-11-29 NOTE — Telephone Encounter (Signed)
LVM for patient to call back. Closing phone note due to multiple attempts to reach patient, if she should call please give her below message

## 2014-12-05 ENCOUNTER — Emergency Department (HOSPITAL_COMMUNITY): Payer: Medicare Other

## 2014-12-05 ENCOUNTER — Encounter (HOSPITAL_COMMUNITY): Payer: Self-pay | Admitting: Emergency Medicine

## 2014-12-05 ENCOUNTER — Emergency Department (HOSPITAL_COMMUNITY)
Admission: EM | Admit: 2014-12-05 | Discharge: 2014-12-05 | Disposition: A | Discharge status: Home / Self Care | Payer: Medicare Other | Attending: Emergency Medicine | Admitting: Emergency Medicine

## 2014-12-05 DIAGNOSIS — J0130 Acute sphenoidal sinusitis, unspecified: Secondary | ICD-10-CM | POA: Diagnosis not present

## 2014-12-05 DIAGNOSIS — Z7982 Long term (current) use of aspirin: Secondary | ICD-10-CM | POA: Insufficient documentation

## 2014-12-05 DIAGNOSIS — Z791 Long term (current) use of non-steroidal anti-inflammatories (NSAID): Secondary | ICD-10-CM | POA: Diagnosis not present

## 2014-12-05 DIAGNOSIS — R200 Anesthesia of skin: Secondary | ICD-10-CM | POA: Diagnosis not present

## 2014-12-05 DIAGNOSIS — F419 Anxiety disorder, unspecified: Secondary | ICD-10-CM | POA: Insufficient documentation

## 2014-12-05 DIAGNOSIS — F329 Major depressive disorder, single episode, unspecified: Secondary | ICD-10-CM

## 2014-12-05 DIAGNOSIS — I10 Essential (primary) hypertension: Secondary | ICD-10-CM | POA: Diagnosis not present

## 2014-12-05 DIAGNOSIS — F3131 Bipolar disorder, current episode depressed, mild: Secondary | ICD-10-CM

## 2014-12-05 DIAGNOSIS — E669 Obesity, unspecified: Secondary | ICD-10-CM | POA: Diagnosis not present

## 2014-12-05 DIAGNOSIS — F32A Depression, unspecified: Secondary | ICD-10-CM

## 2014-12-05 DIAGNOSIS — Z79899 Other long term (current) drug therapy: Secondary | ICD-10-CM | POA: Diagnosis not present

## 2014-12-05 DIAGNOSIS — J0100 Acute maxillary sinusitis, unspecified: Secondary | ICD-10-CM | POA: Diagnosis not present

## 2014-12-05 LAB — COMPREHENSIVE METABOLIC PANEL
ALT: 20 U/L (ref 0–35)
AST: 26 U/L (ref 0–37)
Albumin: 3.9 g/dL (ref 3.5–5.2)
Alkaline Phosphatase: 64 U/L (ref 39–117)
Anion gap: 8 (ref 5–15)
BUN: 17 mg/dL (ref 6–23)
CALCIUM: 9.4 mg/dL (ref 8.4–10.5)
CO2: 26 mmol/L (ref 19–32)
Chloride: 103 mmol/L (ref 96–112)
Creatinine, Ser: 0.74 mg/dL (ref 0.50–1.10)
GFR calc Af Amer: >90 mL/min (ref >90)
GFR calc non Af Amer: 90 mL/min — ABNORMAL LOW (ref >90)
Glucose, Bld: 103 mg/dL — ABNORMAL HIGH (ref 70–99)
POTASSIUM: 5.3 mmol/L — AB (ref 3.5–5.1)
Sodium: 137 mmol/L (ref 135–145)
Total Bilirubin: 1.2 mg/dL (ref 0.3–1.2)
Total Protein: 7.2 g/dL (ref 6.0–8.3)

## 2014-12-05 LAB — CBC
HCT: 44.6 % (ref 36.0–46.0)
Hemoglobin: 14.4 g/dL (ref 12.0–15.0)
MCH: 29.4 pg (ref 26.0–34.0)
MCHC: 32.3 g/dL (ref 30.0–36.0)
MCV: 91 fL (ref 78.0–100.0)
Platelets: 294 10*3/uL (ref 150–400)
RBC: 4.9 MIL/uL (ref 3.87–5.11)
RDW: 14 % (ref 11.5–15.5)
WBC: 6.8 10*3/uL (ref 4.0–10.5)

## 2014-12-05 LAB — RAPID URINE DRUG SCREEN, HOSP PERFORMED
Amphetamines: NOT DETECTED
Barbiturates: NOT DETECTED
Benzodiazepines: NOT DETECTED
Cocaine: NOT DETECTED
Opiates: NOT DETECTED
Tetrahydrocannabinol: NOT DETECTED

## 2014-12-05 LAB — CARBAMAZEPINE LEVEL, TOTAL: CARBAMAZEPINE LVL: 7 ug/mL (ref 4.0–12.0)

## 2014-12-05 LAB — ETHANOL: Alcohol, Ethyl (B): <5 mg/dL (ref 0–9)
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
## 2014-12-05 NOTE — [facility] Counselor (Signed)
Pt was given outpatient referrals and mobile crisis number.   Kateri PlummerKristin Kadeidra Coryell, M.S., LPCA, [facility] Licensed Professional Counselor Associate  Triage Specialist  [facility]  Therapeutic Triage Services Phone: [phone] Fax: [phone]

## 2014-12-05 NOTE — ED Notes (Signed)
Pt is not showing stroke like symptoms, pt states that she was advised to come here by psych office.  Pt is complaining about recent medication change, her daughter having her committed, being diagnosed bipolar, living with girl friend and moving, and experiencing depression.  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  Pt continues to ramble about personal issues.

## 2014-12-05 NOTE — BH Assessment (Signed)
Writer informed TTS Kristein of the consult.  

## 2014-12-05 NOTE — Discharge Instructions (Signed)
It was our pleasure to provide your ER care today - we hope that you feel better.  Follow up with your psychiatrist this Friday as arranged.  Also follow up with primary care doctor in the next few days for recheck.   If mental health crisis, go directly to [facility].  If medical emergency, new symptoms, fevers, one-sided numbness/weakness, other concern, return to the Emergency Department.     Depression Depression refers to feeling sad, low, down in the dumps, blue, gloomy, or empty. In general, there are two kinds of depression:  Normal sadness or normal grief. This kind of depression is one that we all feel from time to time after upsetting life experiences, such as the loss of a job or the ending of a relationship. This kind of depression is considered normal, is short lived, and resolves within a few days to 2 weeks. Depression experienced after the loss of a loved one (bereavement) often lasts longer than 2 weeks but normally gets better with time.  Clinical depression. This kind of depression lasts longer than normal sadness or normal grief or interferes with your ability to function at home, at work, and in school. It also interferes with your personal relationships. It affects almost every aspect of your life. Clinical depression is an illness. Symptoms of depression can also be caused by conditions other than those mentioned above, such as:  Physical illness. Some physical illnesses, including underactive thyroid gland (hypothyroidism), severe anemia, specific types of cancer, diabetes, uncontrolled seizures, heart and lung problems, strokes, and chronic pain are commonly associated with symptoms of depression.  Side effects of some prescription medicine. In some people, certain types of medicine can cause symptoms of depression.  Substance abuse. Abuse of alcohol and illicit drugs can cause symptoms of depression. SYMPTOMS Symptoms of normal sadness and normal grief include the  following:  Feeling sad or crying for short periods of time.  Not caring about anything (apathy).  Difficulty sleeping or sleeping too much.  No longer able to enjoy the things you used to enjoy.  Desire to be by oneself all the time (social isolation).  Lack of energy or motivation.  Difficulty concentrating or remembering.  Change in appetite or weight.  Restlessness or agitation. Symptoms of clinical depression include the same symptoms of normal sadness or normal grief and also the following symptoms:  Feeling sad or crying all the time.  Feelings of guilt or worthlessness.  Feelings of hopelessness or helplessness.  Thoughts of suicide or the desire to harm yourself (suicidal ideation).  Loss of touch with reality (psychotic symptoms). Seeing or hearing things that are not real (hallucinations) or having false beliefs about your life or the people around you (delusions and paranoia). DIAGNOSIS  The diagnosis of clinical depression is usually based on how bad the symptoms are and how long they have lasted. Your health care provider will also ask you questions about your medical history and substance use to find out if physical illness, use of prescription medicine, or substance abuse is causing your depression. Your health care provider may also order blood tests. TREATMENT  Often, normal sadness and normal grief do not require treatment. However, sometimes antidepressant medicine is given for bereavement to ease the depressive symptoms until they resolve. The treatment for clinical depression depends on how bad the symptoms are but often includes antidepressant medicine, counseling with a mental health professional, or both. Your health care provider will help to determine what treatment is best for you. Depression  caused by physical illness usually goes away with appropriate medical treatment of the illness. If prescription medicine is causing depression, talk with your  health care provider about stopping the medicine, decreasing the dose, or changing to another medicine. Depression caused by the abuse of alcohol or illicit drugs goes away when you stop using these substances. Some adults need professional help in order to stop drinking or using drugs. SEEK IMMEDIATE MEDICAL CARE IF:  You have thoughts about hurting yourself or others.  You lose touch with reality (have psychotic symptoms).  You are taking medicine for depression and have a serious side effect. FOR MORE INFORMATION  National Alliance on Mental Illness: www.nami.[employer] of Mental Health: http://www.maynard.net/ Document Released: 10/25/2000 Document Revised: 03/14/2014 Document Reviewed: 01/27/2012 [facility] Patient Information 2015 [place], [place]. This information is not intended to replace advice given to you by your health care provider. Make sure you discuss any questions you have with your health care provider.   Generalized Anxiety Disorder Generalized anxiety disorder (GAD) is a mental disorder. It interferes with life functions, including relationships, work, and school. GAD is different from normal anxiety, which everyone experiences at some point in their lives in response to specific life events and activities. Normal anxiety actually helps [place] prepare for and get through these life events and activities. Normal anxiety goes away after the event or activity is over.  GAD causes anxiety that is not necessarily related to specific events or activities. It also causes excess anxiety in proportion to specific events or activities. The anxiety associated with GAD is also difficult to control. GAD can vary from mild to severe. People with severe GAD can have intense waves of anxiety with physical symptoms (panic attacks).  SYMPTOMS The anxiety and worry associated with GAD are difficult to control. This anxiety and worry are related to many life events and activities and also  occur more days than not for 6 months or longer. People with GAD also have three or more of the following symptoms (one or more in children):  Restlessness.   Fatigue.  Difficulty concentrating.   Irritability.  Muscle tension.  Difficulty sleeping or unsatisfying sleep. DIAGNOSIS GAD is diagnosed through an assessment by your health care provider. Your health care provider will ask you questions aboutyour mood,physical symptoms, and events in your life. Your health care provider may ask you about your medical history and use of alcohol or drugs, including prescription medicines. Your health care provider may also do a physical exam and blood tests. Certain medical conditions and the use of certain substances can cause symptoms similar to those associated with GAD. Your health care provider may refer you to a mental health specialist for further evaluation. TREATMENT The following therapies are usually used to treat GAD:   Medication. Antidepressant medication usually is prescribed for long-term daily control. Antianxiety medicines may be added in severe cases, especially when panic attacks occur.   Talk therapy (psychotherapy). Certain types of talk therapy can be helpful in treating GAD by providing support, education, and guidance. A form of talk therapy called cognitive behavioral therapy can teach you healthy ways to think about and react to daily life events and activities.  Stress managementtechniques. These include yoga, meditation, and exercise and can be very helpful when they are practiced regularly. A mental health specialist can help determine which treatment is best for you. Some people see improvement with one therapy. However, other people require a combination of therapies. Document Released: 02/22/2013 Document Revised:  03/14/2014 Document Reviewed: 02/22/2013 ExitCare Patient Information 2015 [place], [place]. This information is not intended to replace advice given  to you by your health care provider. Make sure you discuss any questions you have with your health care provider.    Bipolar Disorder Bipolar disorder is a mental illness. The term bipolar disorder actually is used to describe a group of disorders that all share varying degrees of emotional highs and lows that can interfere with daily functioning, such as work, school, or relationships. Bipolar disorder also can lead to drug abuse, hospitalization, and suicide. The emotional highs of bipolar disorder are periods of elation or irritability and high energy. These highs can range from a mild form (hypomania) to a severe form (mania). People experiencing episodes of hypomania may appear energetic, excitable, and highly productive. People experiencing mania may behave impulsively or erratically. They often make poor decisions. They may have difficulty sleeping. The most severe episodes of mania can involve having very distorted beliefs or perceptions about the world and seeing or hearing things that are not real (psychotic delusions and hallucinations).  The emotional lows of bipolar disorder (depression) also can range from mild to severe. Severe episodes of bipolar depression can involve psychotic delusions and hallucinations. Sometimes people with bipolar disorder experience a state of mixed mood. Symptoms of hypomania or mania and depression are both present during this mixed-mood episode. SIGNS AND SYMPTOMS There are signs and symptoms of the episodes of hypomania and mania as well as the episodes of depression. The signs and symptoms of hypomania and mania are similar but vary in severity. They include:  Inflated self-esteem or feeling of increased self-confidence.  Decreased need for sleep.  Unusual talkativeness (rapid or pressured speech) or the feeling of a need to keep talking.  Sensation of racing thoughts or constant talking, with quick shifts between topics that may or may not be related  (flight of ideas).  Decreased ability to focus or concentrate.  Increased purposeful activity, such as work, studies, or social activity, or nonproductive activity, such as pacing, squirming and fidgeting, or finger and toe tapping.  Impulsive behavior and use of poor judgment, resulting in high-risk activities, such as having unprotected sex or spending excessive amounts of money. Signs and symptoms of depression include the following:   Feelings of sadness, hopelessness, or helplessness.  Frequent or uncontrollable episodes of crying.  Lack of feeling anything or caring about anything.  Difficulty sleeping or sleeping too much.  Inability to enjoy the things you used to enjoy.   Desire to be alone all the time.   Feelings of guilt or worthlessness.  Lack of energy or motivation.   Difficulty concentrating, remembering, or making decisions.  Change in appetite or weight beyond normal fluctuations.  Thoughts of death or the desire to harm yourself. DIAGNOSIS  Bipolar disorder is diagnosed through an assessment by your caregiver. Your caregiver will ask questions about your emotional episodes. There are two main types of bipolar disorder. People with type I bipolar disorder have manic episodes with or without depressive episodes. People with type II bipolar disorder have hypomanic episodes and major depressive episodes, which are more serious than mild depression. The type of bipolar disorder you have can make an important difference in how your illness is monitored and treated. Your caregiver may ask questions about your medical history and use of alcohol or drugs, including prescription medication. Certain medical conditions and substances also can cause emotional highs and lows that resemble bipolar disorder (secondary bipolar  disorder).  TREATMENT  Bipolar disorder is a long-term illness. It is best controlled with continuous treatment rather than treatment only when  symptoms occur. The following treatments can be prescribed for bipolar disorders:  Medication--Medication can be prescribed by a doctor that is an expert in treating mental disorders (psychiatrists). Medications called mood stabilizers are usually prescribed to help control the illness. Other medications are sometimes added if symptoms of mania, depression, or psychotic delusions and hallucinations occur despite the use of a mood stabilizer.  Talk therapy--Some forms of talk therapy are helpful in providing support, education, and guidance. A combination of medication and talk therapy is best for managing the disorder over time. A procedure in which electricity is applied to your brain through your scalp (electroconvulsive therapy) is used in cases of severe mania when medication and talk therapy do not work or work too slowly. Document Released: 02/03/2001 Document Revised: 02/22/2013 Document Reviewed: 11/23/2012 [facility] Patient Information 2015 [place], [place]. This information is not intended to replace advice given to you by your health care provider. Make sure you discuss any questions you have with your health care provider.    Paresthesia Paresthesia is an abnormal burning or prickling sensation. This sensation is generally felt in the hands, arms, legs, or feet. However, it may occur in any part of the body. It is usually not painful. The feeling may be described as:  Tingling or numbness.  "Pins and needles."  Skin crawling.  Buzzing.  Limbs "falling asleep."  Itching. Most people experience temporary (transient) paresthesia at some time in their lives. CAUSES  Paresthesia may occur when you breathe too quickly (hyperventilation). It can also occur without any apparent cause. Commonly, paresthesia occurs when pressure is placed on a nerve. The feeling quickly goes away once the pressure is removed. For some people, however, paresthesia is a long-lasting (chronic) condition  caused by an underlying disorder. The underlying disorder may be:  A traumatic, direct injury to nerves. Examples include a:  Broken (fractured) neck.  Fractured skull.  A disorder affecting the brain and spinal cord (central nervous system). Examples include:  Transverse myelitis.  Encephalitis.  Transient ischemic attack.  Multiple sclerosis.  Stroke.  Tumor or blood vessel problems, such as an arteriovenous malformation pressing against the brain or spinal cord.  A condition that damages the peripheral nerves (peripheral neuropathy). Peripheral nerves are not part of the brain and spinal cord. These conditions include:  Diabetes.  Peripheral vascular disease.  Nerve entrapment syndromes, such as carpal tunnel syndrome.  Shingles.  Hypothyroidism.  Vitamin B12 deficiencies.  Alcoholism.  Heavy metal poisoning (lead, arsenic).  Rheumatoid arthritis.  Systemic lupus erythematosus. DIAGNOSIS  Your caregiver will attempt to find the underlying cause of your paresthesia. Your caregiver may:  Take your medical history.  Perform a physical exam.  Order various lab tests.  Order imaging tests. TREATMENT  Treatment for paresthesia depends on the underlying cause. HOME CARE INSTRUCTIONS  Avoid drinking alcohol.  You may consider massage or acupuncture to help relieve your symptoms.  Keep all follow-up appointments as directed by your caregiver. SEEK IMMEDIATE MEDICAL CARE IF:   You feel weak.  You have trouble walking or moving.  You have problems with speech or vision.  You feel confused.  You cannot control your bladder or bowel movements.  You feel numbness after an injury.  You faint.  Your burning or prickling feeling gets worse when walking.  You have pain, cramps, or dizziness.  You develop a rash.  MAKE SURE YOU:  Understand these instructions.  Will watch your condition.  Will get help right away if you are not doing well or get  worse. Document Released: 10/18/2002 Document Revised: 01/20/2012 Document Reviewed: 07/19/2011 [facility] Patient Information 2015 [place], [place]. This information is not intended to replace advice given to you by your health care provider. Make sure you discuss any questions you have with your health care provider.    Emergency Department Resource Guide 1) Find a Doctor and Pay Out of Pocket Although you won't have to find out who is covered by your insurance plan, it is a good idea to ask around and get recommendations. You will then need to call the office and see if the doctor you have chosen will accept you as a new patient and what types of options they offer for patients who are self-pay. Some doctors offer discounts or will set up payment plans for their patients who do not have insurance, but you will need to ask so you aren't surprised when you get to your appointment.  2) Contact Your Local Health Department Not all health departments have doctors that can see patients for sick visits, but many do, so it is worth a call to see if yours does. If you don't know where your local health department is, you can check in your phone book. The CDC also has a tool to help you locate your state's health department, and many state websites also have listings of all of their local health departments.  3) Find a Walk-in Clinic If your illness is not likely to be very severe or complicated, you may want to try a walk in clinic. These are popping up all over the country in pharmacies, drugstores, and shopping centers. They're usually staffed by nurse practitioners or physician assistants that have been trained to treat common illnesses and complaints. They're usually fairly quick and inexpensive. However, if you have serious medical issues or chronic medical problems, these are probably not your best option.  No Primary Care Doctor: - Call Health Connect at  [phone] - they can help you locate a primary  care doctor that  accepts your insurance, provides certain services, etc. - Physician Referral Service- [phone]  Chronic Pain Problems: Organization         Address  Phone   Notes  [facility]  [phone] Patients need to be referred by their primary care doctor.   Medication Assistance: Organization         Address  Phone   Notes  [facility] [address] [place], [place] [address] [phone] --Must be a resident of [place] -- Must have NO insurance coverage whatsoever (no Medicaid/ Medicare, etc.) -- The pt. MUST have a primary care doctor that directs their care regularly and follows them in the community   [facility]  [phone]   [employer]  [phone]    Agencies that provide inexpensive medical care: Organization         Address  Phone   Notes  [facility]  [phone]   [facility]    [phone]   [facility] [address] [place], [place] [address] [phone]   [facility] [address], [place] [phone]   [facility]    [phone]   [facility]    [phone]   [facility]  [address], [place] Phone:  [phone], Fax:  [phone] Hours of Operation:  9 am - 6 pm, M-F.  Also accepts Medicaid/Medicare and self-pay.  [facility]  [address], [place] Phone: [phone], Fax: [phone]. Hours of Operation:  8:30 am - 5:30 pm, M-F.  Also accepts Medicaid and self-pay.  [facility] [place] [address], [place] Phone: [phone]   [facility] [address] Natasha Bence [place], [place] [phone], Ext. [phone] Mondays & Thursdays: 7-9 AM.  First 15 patients are seen on a first come, first serve basis.    Medicaid-accepting [facility]  Providers:  Organization         Address  Phone   Notes  [facility] [address],  [phone] Also accepts self-pay patients.  [facility] [address] Laurell Josephs [place], [place]  [phone]   [facility] [address], [place] [phone]   [facility] [address] [place] [phone]   Renaye Rakers [address], [place]   [phone] Only accepts [place] Access [place] patients after they have their name applied to their card.   Self-Pay (no insurance) in [facility]:  Organization         Address  Phone   Notes  Sickle Cell Patients, [facility] [address] [place], [place] [phone]   [facility] [address] [place], [place] [phone]   [facility]  [address], [place] [phone]   [facility]/Dr. Osei-Bonsu  [address] [place] or [address], [place] [phone] Phone number for both [place] and [place] locations is the same.  [facility]gent [facility] and [facility] [address], [place] [phone]   [facility] [address] [place] or [address] [phone]   [facility] [address], [place] [phone], phone; [phone], fax Sees patients 1st and 3rd Saturday of every month.  Must not qualify for public or private insurance (i.e. Medicaid, Medicare, [place] Health Choice, Veterans' Benefits)  Household income should be no more than 200% of the poverty level The clinic cannot treat you if you are pregnant or think you are pregnant  Sexually transmitted diseases are not treated at the clinic.    Dental Care: Organization         Address  Phone  Notes  [facility] Department of [facility] [address] [place], [place] [phone] Accepts children up to age 52 who are enrolled in [place] or [place] Health Choice; pregnant women with a Medicaid card; and children who have applied for Medicaid or [place] Health Choice, but were declined, whose parents can pay a reduced fee at time of service.  [facility] Department of [facility]  [address], [place] [phone] Accepts children up to age 52 who are enrolled in [place] or [place] Health Choice; pregnant women with a Medicaid card; and children who have applied for Medicaid or [place] Health Choice, but were declined, whose parents can pay a reduced fee at time of service.  [facility]  [phone]  Joellyn Quails, [place] [phone] Patients are seen by appointment only. Walk-ins are not accepted. [facility] will see patients 58 years of age and older. Monday - Tuesday (8am-5pm) Most Wednesdays (8:30-5pm) $30 per visit, cash only  [facility]  [address], [facility] [place] [phone] Patients are seen by appointment only. Walk-ins are not accepted. [facility] will see patients 90 years of age and older. One Wednesday Evening (Monthly: Volunteer Based).  $30 per visit, cash only  [employer]  [phone] for adults; Children under age 21, call Graduate Pediatric Dentistry at [phone]. Children aged 97-14, please call [phone] to request a pediatric application.  Dental services are provided in all areas of dental care including fillings, crowns and bridges, complete and partial dentures, implants, gum treatment, root canals, and extractions. Preventive care is also provided. Treatment is provided to both adults and children. Patients are selected via a lottery and there is often a waiting list.   [facility] [address] [place]  [phone] [email or web address]   [facility]  [address] [place], [place] [phone], Ext. [phone] Second and Fourth Thursday of each month, opens at 6:30 AM; Clinic ends at 9 AM.  Patients are seen on a first-come first-served basis, and a limited number are seen during each clinic.   [facility]  [address] [place], [place] [phone]   Eligibility Requirements You must have lived in [place], [place], or [place] counties for at least the last three months.   You cannot be eligible for state or federal sponsored [place], including [employer], [place], or [employer].   You generally cannot be eligible for healthcare insurance through your employer.    How to apply: Eligibility screenings are held every Tuesday and Wednesday afternoon from 1:00 pm until 4:00 pm. You do not need an appointment for the interview!  [facility] [address] [place], [place] [phone]   [facility] Health Department  [phone]   [facility] Health Department  [phone]   [facility] Health Department  [phone]    Behavioral Health Resources in the Community: Intensive Outpatient Programs Organization         Address  Phone  Notes  [facility] Services [address], [place], [place] [phone]   [facility] [address], [place], [place] [phone]   [facility] [address], [place], [place]  [phone]   [facility] Mental Health [address]  [place], [place] [phone] or [phone]   Substance Abuse Resources Organization         Address  Phone  Notes  [facility]  [phone]   [facility]  [phone]   [facility] [place]  [phone]   Floydene Flock  [phone]   Residential & Outpatient Substance Abuse Program  [phone]   Psychological Services Organization         Address  Phone  Notes  [facility]  [phone]    [facility] Services  [phone]   [facility] Mental Health [address] [place] [phone] or [phone]    Mobile Crisis Teams Organization         Address  Phone  Notes  [facility], Mobile Crisis Care Unit  [phone]   [facility]  [address] [place], [place] [phone]   Jasmine December  De[place] [address] [place] [phone]    Self-Help/Support Groups Organization         Address  Phone             Notes  [facility] [place] - variety of support groups  [phone]- [phone] Call for more information  Narcotics Anonymous (NA), [facility] [address], [employer] [place]  2 meetings at this location   Statistician         Address  Phone  Notes  [facility] [address],    [place]  [phone]   [facility]  [address] [place] [address], [place], [place] [phone]   [facility] [address] [place], [place] [phone] Admissions: 8am-3pm M-F  [facility] [address]    [place], [place] [phone]   [facility] [address] [place], [place], [place] [phone]   [facility] [address]  [place], [place] [phone]   [facility] - Intensive Outpatient [address], [place], [place] [phone]   [facility] ([facility] [address] [place].,  [place], [place] [phone] or [phone]   [facility] ([facility]) [address], [place], [place] [phone] Accepts Medicaid  [facility] [address]  [place] [phone] Substance Abuse/Addiction Treatment   [facility] Organization         Address  Phone  Notes  [facility]  [phone]   Angie Fava, PhD [address] [place], [place]   [phone] or [phone]    [facility] Behavioral   [address] [place], [place] [phone]   [facility] [address] [place], [place] [phone] Insurance/Medicaid/sponsorship through [facility] and Families [address]                                    [place], [place] [phone] Therapy/tele-psych/case  [facility] [address], [place] [phone]    Dr. Lolly Mustache  [phone]   [facility]  [employer] [facility] Dept. 1) [address] [place] 2) [address], [place] 3)  [address], [place] [phone]  [phone]   [facility] Child Abuse Hotline [phone] or 7850508148 (After Hours)

## 2014-12-07 ENCOUNTER — Other Ambulatory Visit: Payer: Self-pay | Admitting: *Deleted

## 2014-12-07 MED ORDER — DICLOFENAC SODIUM 75 MG PO TBEC: 75.0000 mg | DELAYED_RELEASE_TABLET | Freq: Two times a day (BID) | ORAL | Status: DC

## 2014-12-13 ENCOUNTER — Encounter: Payer: Self-pay | Admitting: Family Medicine

## 2014-12-13 ENCOUNTER — Ambulatory Visit (INDEPENDENT_AMBULATORY_CARE_PROVIDER_SITE_OTHER): Payer: Medicare Other | Admitting: Family Medicine

## 2014-12-13 VITALS — BP 142/78 | HR 76 | Temp 98.0°F | Ht 66.0 in | Wt 279.6 lb

## 2014-12-13 DIAGNOSIS — F32A Depression, unspecified: Secondary | ICD-10-CM

## 2014-12-13 DIAGNOSIS — Z0000 Encounter for general adult medical examination without abnormal findings: Secondary | ICD-10-CM | POA: Diagnosis not present

## 2014-12-13 DIAGNOSIS — F329 Major depressive disorder, single episode, unspecified: Secondary | ICD-10-CM | POA: Diagnosis not present

## 2014-12-13 DIAGNOSIS — I10 Essential (primary) hypertension: Secondary | ICD-10-CM

## 2014-12-13 MED ORDER — HYDROCHLOROTHIAZIDE 25 MG PO TABS: 25.0000 mg | ORAL_TABLET | Freq: Every day | ORAL | Status: DC

## 2014-12-13 NOTE — Assessment & Plan Note (Signed)
Needs mammogram.  Recommended to go today.

## 2014-12-13 NOTE — Assessment & Plan Note (Addendum)
Currently at goal, despite her multiple issues at home. PHQ-9 is negative today.   MDQ also only positive for money issues, otherwise negative.  Psych increased her to 40 mg celexa. She is getting her dog back in a week and is excited about this.   Seems to be weathering this storm very well.

## 2014-12-13 NOTE — Patient Instructions (Signed)
Start the HCTZ.  It's $4 at [facility] and also covered by insurance.   It was good to see you again today -- I'm glad you're weathering things so well!  Also you should get a mammogram.

## 2014-12-13 NOTE — Progress Notes (Signed)
Subjective:    Paige Jenkins is a 64 y.o. female who [facility] for FU from Ed:  1.  ED visit:  Daughter tried to have her admitted for "bipolar disorder" about a week ago.  Patient bought a dog and her daughter said this was evidence of manic episode.  Diagnosed with bipolar by unknown psychiatrist in [place] -- although she was never examined by this physician.  Daughter's boyfriend moved in at Christmas and problems started then.    Her long-term psychiatrist states that NO she does not have Bipolar disorder.  Instead, she has major depressive disorder, and has been doing well.  She follows up with him every 4-6 weeks.  Denies any depressive symptoms.  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  No SI/HI.  [place] now [place] an apartment [place] in [place] - social services deemed she is not safe to return to home environment in [place] with her daughter.  Has been doing well living on her own.    2.  Hypertension:  Long-term problem for this patient.  No adverse effects from medication.  Not checking it regularly.  No HA, CP, dizziness, shortness of breath, palpitations, or LE swelling.  Currently on Toprol XL 25 mg.   BP Readings from Last 3 Encounters:  12/13/14 155/79  12/05/14 158/89  11/01/14 171/81   Prev health:  Needs mammogram.  ROS as above per HPI, otherwise neg.    The following portions of the patient's history were reviewed and updated as appropriate: allergies, current medications, past medical history, family and social history, and problem list. Patient is a nonsmoker.    PMH reviewed.  Past Medical History  Diagnosis Date  . Obesity   . Hypertension   . Depression    Past Surgical History  Procedure Laterality Date  . 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 Cytocele repair  1990    Medications reviewed. Current Outpatient Prescriptions  Medication Sig Dispense Refill   . aspirin 81 MG chewable tablet Chew 81 mg by mouth daily as needed (to prevent heartattacks).     . carbamazepine (TEGRETOL) 200 MG tablet Take 200 mg by mouth 3 (three) times daily.    . citalopram (CELEXA) 40 MG tablet Take 2 tablets (80 mg total) by mouth daily. (Patient not taking: Reported on 12/05/2014) 30 tablet 1  . diclofenac (VOLTAREN) 75 MG EC tablet Take 1 tablet (75 mg total) by mouth 2 (two) times daily. 60 tablet 1  . ibuprofen (ADVIL,MOTRIN) 600 MG tablet Take 600 mg by mouth every 6 (six) hours as needed. For pain    . metoprolol succinate (TOPROL-XL) 25 MG 24 hr tablet TAKE ONE TABLET BY MOUTH EVERY DAY (Patient not taking: Reported on 12/05/2014) 90 tablet 2  . metoprolol succinate (TOPROL-XL) 50 MG 24 hr tablet Take 25 mg by mouth daily. Take with or immediately following a meal.    . OLANZapine (ZYPREXA) 5 MG tablet Take 7.5 mg by mouth at bedtime.    . terbinafine (LAMISIL) 250 MG tablet Take 1 tablet (250 mg total) by mouth daily. 30 tablet 0  . traMADol (ULTRAM) 50 MG tablet Take 1 tablet (50 mg total) by mouth every 8 (eight) hours as needed. 30 tablet 1  . traZODone (DESYREL) 50 MG tablet Take 50-100 mg by mouth at bedtime as needed for sleep. To help sleep.    .Marland Kitchen  zoster vaccine live, PF, (ZOSTAVAX) 40981 UNT/0.65ML injection Inject 19,400 Units into the skin once. 1 each 0   No current facility-administered medications for this visit.     Objective:   Physical Exam BP 155/79 mmHg  Pulse 76  Temp(Src) 98 F (36.7 C) (Oral)  Ht  (1.676 m)  Wt 279 lb 9.6 oz (126.826 kg)  BMI 45.15 kg/m2 Gen:  Alert, cooperative patient who appears stated age in no acute distress.  Vital signs reviewed. HEENT: EOMI,  MMM Cardiac:  Regular rate and rhythm . Pulm:  Clear to auscultation bilaterally   Exts: Non edematous BL  LE, warm and well perfused.  Psych:  Not depressed, anxious, or manic appearing today.  Linear and coherent thought process.  NO SI/HI.  Smiling  spontaneously.    No results found for this or any previous visit (from the past 72 hour(s)).

## 2014-12-13 NOTE — Assessment & Plan Note (Addendum)
Not controlled currently.   Start HCTZ.  Discussed stopping the Toprol as she has no clear indication for this, but she would like this more as a cardioprotective agent.   FU in about 6 weeks to check on BP with addition of HCTZ.

## 2015-01-19 ENCOUNTER — Other Ambulatory Visit: Payer: Self-pay | Admitting: Family Medicine

## 2015-01-26 ENCOUNTER — Other Ambulatory Visit: Payer: Self-pay | Admitting: Family Medicine

## 2015-01-26 NOTE — Telephone Encounter (Signed)
Called rx in  

## 2015-01-26 NOTE — Telephone Encounter (Signed)
Pt is calling about her Tramadol. I see in Epic where is was written on 01/20/15 but it is not up front or at [facility]. Can we call patient when this is up front so that she can pick it up or call this in to [facility]. jw

## 2015-03-11 ENCOUNTER — Other Ambulatory Visit: Payer: Self-pay | Admitting: Family Medicine

## 2015-05-05 ENCOUNTER — Other Ambulatory Visit: Payer: Self-pay | Admitting: Family Medicine

## 2015-05-05 NOTE — Telephone Encounter (Signed)
LM on pharmacy line with refill. Jazmin Hartsell,CMA

## 2015-05-05 NOTE — Telephone Encounter (Signed)
Nursing staff - please call in Tramadol 50 mg Q8 hours prn pain, dispense #30, refill #0, thanks

## 2015-05-21 DIAGNOSIS — F329 Major depressive disorder, single episode, unspecified: Secondary | ICD-10-CM | POA: Diagnosis not present

## 2015-05-21 DIAGNOSIS — I10 Essential (primary) hypertension: Secondary | ICD-10-CM | POA: Diagnosis not present

## 2015-05-21 DIAGNOSIS — E669 Obesity, unspecified: Secondary | ICD-10-CM | POA: Diagnosis not present

## 2015-05-21 DIAGNOSIS — Z91041 Radiographic dye allergy status: Secondary | ICD-10-CM | POA: Diagnosis not present

## 2015-05-21 DIAGNOSIS — Z79899 Other long term (current) drug therapy: Secondary | ICD-10-CM | POA: Diagnosis not present

## 2015-05-21 DIAGNOSIS — R197 Diarrhea, unspecified: Secondary | ICD-10-CM | POA: Diagnosis not present

## 2015-05-21 DIAGNOSIS — R42 Dizziness and giddiness: Secondary | ICD-10-CM | POA: Diagnosis not present

## 2015-05-21 DIAGNOSIS — Z91012 Allergy to eggs: Secondary | ICD-10-CM | POA: Diagnosis not present

## 2015-05-21 DIAGNOSIS — Z791 Long term (current) use of non-steroidal anti-inflammatories (NSAID): Secondary | ICD-10-CM | POA: Diagnosis not present

## 2015-06-13 ENCOUNTER — Ambulatory Visit: Payer: Medicare Other | Admitting: Family Medicine

## 2015-06-21 ENCOUNTER — Other Ambulatory Visit: Payer: Self-pay | Admitting: Family Medicine

## 2015-06-27 ENCOUNTER — Ambulatory Visit (INDEPENDENT_AMBULATORY_CARE_PROVIDER_SITE_OTHER): Payer: Medicare Other | Admitting: Family Medicine

## 2015-06-27 ENCOUNTER — Encounter: Payer: Self-pay | Admitting: Family Medicine

## 2015-06-27 ENCOUNTER — Telehealth: Payer: Self-pay

## 2015-06-27 ENCOUNTER — Other Ambulatory Visit: Payer: Self-pay | Admitting: Family Medicine

## 2015-06-27 VITALS — BP 164/90 | HR 76 | Temp 99.0°F | Wt 274.2 lb

## 2015-06-27 DIAGNOSIS — F32A Depression, unspecified: Secondary | ICD-10-CM

## 2015-06-27 DIAGNOSIS — N8320 Unspecified ovarian cysts: Secondary | ICD-10-CM | POA: Diagnosis not present

## 2015-06-27 DIAGNOSIS — N83209 Unspecified ovarian cyst, unspecified side: Secondary | ICD-10-CM

## 2015-06-27 DIAGNOSIS — I10 Essential (primary) hypertension: Secondary | ICD-10-CM | POA: Diagnosis not present

## 2015-06-27 DIAGNOSIS — M25561 Pain in right knee: Secondary | ICD-10-CM

## 2015-06-27 DIAGNOSIS — Z0000 Encounter for general adult medical examination without abnormal findings: Secondary | ICD-10-CM

## 2015-06-27 DIAGNOSIS — M25571 Pain in right ankle and joints of right foot: Secondary | ICD-10-CM | POA: Diagnosis not present

## 2015-06-27 DIAGNOSIS — F329 Major depressive disorder, single episode, unspecified: Secondary | ICD-10-CM

## 2015-06-27 MED ORDER — LISINOPRIL-HYDROCHLOROTHIAZIDE 20-25 MG PO TABS: 1.0000 | ORAL_TABLET | Freq: Every day | ORAL | Status: DC

## 2015-06-27 MED ORDER — CITALOPRAM HYDROBROMIDE 40 MG PO TABS: 60.0000 mg | ORAL_TABLET | Freq: Every day | ORAL | Status: DC

## 2015-06-27 MED ORDER — CLONAZEPAM 0.5 MG PO TABS: 0.5000 mg | ORAL_TABLET | Freq: Every day | ORAL | Status: DC

## 2015-06-27 NOTE — Telephone Encounter (Signed)
LM for pt to call office. Re u/s appt. Sunday Spillers, CMA

## 2015-06-27 NOTE — Progress Notes (Signed)
Subjective:    Paige Jenkins is a 64 y.o. female who presents to [facility] today for several issues:  1.  Right knee and ankle:  Weakness and pain continue.  Walking with cane.  Has forms for transportation today.  Better with Diclofenac.  Cannot walk 3 blocks, difficulty even crossing the street due to pain and weakness.    2.  Hypertension:  Long-term problem for this patient.  No adverse effects from medication.  Not checking it regularly.  No HA, CP, dizziness, shortness of breath, palpitations, or LE swelling.  Running 160s at home as well.  Thinks stress is making this worse.   BP Readings from Last 3 Encounters:  06/27/15 164/90  12/13/14 142/78  12/05/14 158/89    3.  Diarrhea:  Recent ED in July visit for diarrhea.  Lasted about 1 week.  [facility] ED.  Recommended to FU with PCP for GI referral.  Took Imodium for relief.  No N/V.  Resolved completely as of now.  Eating and drinking well now.      4.  Anxiety:  Has started seeing psychiatrist.  Was on Tegretol and had dizziness and double vision from this.  Told she does NOT have Bipolar.  Now on Celexa 60 mg daily.    Prev health:  Needs mammogram.    ROS as above per HPI, otherwise neg.  Pertinently, no chest pain, palpitations, SOB, Fever, Chills, Abd pain, N/V/D.   The following portions of the patient's history were reviewed and updated as appropriate: allergies, current medications, past medical history, family and social history, and problem list. Patient is a nonsmoker.    PMH reviewed.  Past Medical History  Diagnosis Date  . Obesity   . Hypertension   . Depression    Past Surgical History  Procedure Laterality Date  . 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 Cytocele repair  1990    Medications reviewed. Current Outpatient Prescriptions  Medication Sig Dispense Refill  . aspirin 81 MG chewable tablet Chew 81 mg by mouth daily as needed (to prevent heartattacks).     . carbamazepine  (TEGRETOL) 200 MG tablet Take 200 mg by mouth 3 (three) times daily.    . citalopram (CELEXA) 40 MG tablet Take 2 tablets (80 mg total) by mouth daily. (Patient not taking: Reported on 12/05/2014) 30 tablet 1  . diclofenac (VOLTAREN) 75 MG EC tablet Take 1 tablet (75 mg total) by mouth 2 (two) times daily. 60 tablet 1  . hydrochlorothiazide (HYDRODIURIL) 25 MG tablet Take 1 tablet (25 mg total) by mouth daily. 90 tablet 3  . ibuprofen (ADVIL,MOTRIN) 600 MG tablet Take 600 mg by mouth every 6 (six) hours as needed. For pain    . metoprolol succinate (TOPROL-XL) 25 MG 24 hr tablet TAKE ONE TABLET BY MOUTH EVERY DAY (Patient not taking: Reported on 12/05/2014) 90 tablet 2  . metoprolol succinate (TOPROL-XL) 50 MG 24 hr tablet Take 25 mg by mouth daily. Take with or immediately following a meal.    . terbinafine (LAMISIL) 250 MG tablet Take 1 tablet (250 mg total) by mouth daily. 30 tablet 0  . traMADol (ULTRAM) 50 MG tablet TAKE ONE TABLET BY MOUTH EVERY 8 HOURS AS NEEDED FOR PAIN 30 tablet 1  . traZODone (DESYREL) 50 MG tablet Take 50-100 mg by mouth at bedtime as needed for sleep. To help sleep.    Marland Kitchen zoster vaccine live, PF, (ZOSTAVAX) 40981  UNT/0.65ML injection Inject 19,400 Units into the skin once. 1 each 0   No current facility-administered medications for this visit.     Objective:   Physical Exam BP 164/90 mmHg  Pulse 76  Temp(Src) 99 F (37.2 C) (Oral)  Wt 274 lb 3.2 oz (124.376 kg) Gen:  Alert, cooperative patient who appears stated age in no acute distress.  Vital signs reviewed. HEENT: EOMI,  MMM Cardiac:  Regular rate and rhythm  Pulm:  Clear to auscultation bilaterally .   Abd:  Soft/nondistended/nontender.  Good bowel sounds throughout all four quadrants.  No masses noted.  MSK:  No effusion Right knee.  No bruising.  Mild TTP across joint line.  No joint laxity noted.  Psych:  No SI/HI.  Smiling and appropriate affect.    No results found for this or any previous visit (from  the past 72 hour(s)).

## 2015-06-27 NOTE — Patient Instructions (Signed)
Take the Lisinopril-HCTZ combination pill.  Schedule a lab appt and blood pressure check in 2 weeks on your way out today.  Completed transportation paperwork.  Mammogram info.  It was good to see you today!  If your blood pressure and labs are good, I"ll see you back in 3 months or so.

## 2015-06-27 NOTE — Telephone Encounter (Signed)
Pt called. I informed her she has an U/S appt at [facility] on Thurs  am.  Needs to arrive at 715 with a full bladder. Sunday Spillers, CMA

## 2015-06-28 NOTE — Assessment & Plan Note (Signed)
As with knee pain.  Chronic and stable.  Water aerobics for exercise - encouraged to continue Analgesia as needed. Limits mobility.  [employer] info completed.

## 2015-06-28 NOTE — Assessment & Plan Note (Signed)
Now seeing psychiatrist.  Who has put her on Klonopin. She feels this has greatly helped.  Encouraged to continue

## 2015-06-28 NOTE — Assessment & Plan Note (Signed)
Not at goal.  Add Lisinopril to HCTZ as combination pill.  FU in 2 weeks for BP and creatinine check.

## 2015-06-28 NOTE — Assessment & Plan Note (Signed)
Chronic.   Better with Diclofenac and Tramadol. Doing PT exercises at home.  Stable.  Forms for [employer] transportation completed today.

## 2015-06-28 NOTE — Assessment & Plan Note (Signed)
Mammogram info given today.

## 2015-06-29 ENCOUNTER — Ambulatory Visit (HOSPITAL_COMMUNITY)
Admission: RE | Admit: 2015-06-29 | Discharge: 2015-06-29 | Disposition: A | Discharge status: Home / Self Care | Payer: Medicare Other | Source: Ambulatory Visit | Attending: Family Medicine | Admitting: Family Medicine

## 2015-06-29 ENCOUNTER — Other Ambulatory Visit: Payer: Self-pay | Admitting: Family Medicine

## 2015-06-29 DIAGNOSIS — N83209 Unspecified ovarian cyst, unspecified side: Secondary | ICD-10-CM

## 2015-06-29 DIAGNOSIS — R938 Abnormal findings on diagnostic imaging of other specified body structures: Secondary | ICD-10-CM | POA: Insufficient documentation

## 2015-06-29 DIAGNOSIS — N838 Other noninflammatory disorders of ovary, fallopian tube and broad ligament: Secondary | ICD-10-CM | POA: Diagnosis not present

## 2015-06-29 DIAGNOSIS — Z90710 Acquired absence of both cervix and uterus: Secondary | ICD-10-CM | POA: Insufficient documentation

## 2015-06-29 DIAGNOSIS — N8320 Unspecified ovarian cysts: Secondary | ICD-10-CM | POA: Diagnosis present

## 2015-07-12 ENCOUNTER — Other Ambulatory Visit: Payer: Medicare Other

## 2015-07-14 ENCOUNTER — Ambulatory Visit (INDEPENDENT_AMBULATORY_CARE_PROVIDER_SITE_OTHER): Payer: Medicare Other | Admitting: *Deleted

## 2015-07-14 ENCOUNTER — Other Ambulatory Visit: Payer: Medicare Other

## 2015-07-14 VITALS — BP 138/76 | HR 79

## 2015-07-14 DIAGNOSIS — I10 Essential (primary) hypertension: Secondary | ICD-10-CM | POA: Diagnosis not present

## 2015-07-14 DIAGNOSIS — Z0130 Encounter for examination of blood pressure without abnormal findings: Secondary | ICD-10-CM

## 2015-07-14 DIAGNOSIS — Z136 Encounter for screening for cardiovascular disorders: Secondary | ICD-10-CM

## 2015-07-14 LAB — BASIC METABOLIC PANEL
BUN: 22 mg/dL (ref 7–25)
CO2: 25 mmol/L (ref 20–31)
Calcium: 9.5 mg/dL (ref 8.6–10.4)
Chloride: 102 mmol/L (ref 98–110)
Creat: 0.74 mg/dL (ref 0.50–0.99)
Glucose, Bld: 102 mg/dL — ABNORMAL HIGH (ref 65–99)
POTASSIUM: 4.4 mmol/L (ref 3.5–5.3)
SODIUM: 139 mmol/L (ref 135–146)

## 2015-07-14 NOTE — Progress Notes (Signed)
Bmp done today Paige Jenkins 

## 2015-07-14 NOTE — Progress Notes (Signed)
   Patient in nurse clinic this morning for blood pressure check.  Blood pressure 138/76 manually.  Patient stated she is taking her blood pressure medication as prescribed.  Patient denies any symptoms today.  Will forward to PCP. Clovis Pu, RN

## 2015-07-18 ENCOUNTER — Encounter: Payer: Self-pay | Admitting: Family Medicine

## 2015-07-18 ENCOUNTER — Telehealth: Payer: Self-pay | Admitting: Family Medicine

## 2015-07-20 ENCOUNTER — Other Ambulatory Visit: Payer: Self-pay | Admitting: Family Medicine

## 2015-07-20 NOTE — Telephone Encounter (Signed)
Had to leave message. Wanted to discuss results of ultrasound with her.  Said in message that I'm in clinic tomorrow.

## 2015-07-20 NOTE — Telephone Encounter (Signed)
Patient called back.  Discussed cyst with her. She has had this for years, will obtain records from [facility] in [place].  However, if unable to do this, we will get pelvic MRI.  She will attempt calling them on her own and then fill out a patient request form here if that doesn't work.    If unable to get much further with this, will simply get MRI as above.  She agrees with plan.

## 2015-07-27 ENCOUNTER — Other Ambulatory Visit: Payer: Self-pay | Admitting: Family Medicine

## 2015-07-27 NOTE — Telephone Encounter (Signed)
Needs refill on lisionpril and HCTZ. Her BP was 118/84 at her psychiatrist office earlier in the week "medication is working nicely"

## 2015-08-25 ENCOUNTER — Telehealth: Payer: Self-pay | Admitting: *Deleted

## 2015-08-25 NOTE — Telephone Encounter (Signed)
Patient walked into clinic today requesting PCP to complete [employer] form.  The form is requesting a medical diagnosis that is causing patient's disability.  Form placed in provider box for review.  Clovis PuMartin, Catharina Pica L, RN

## 2015-08-29 NOTE — Telephone Encounter (Signed)
Patient informed that forms were faxed and original copy mailed to home address.  Clovis PuMartin, Tamika L, RN

## 2015-08-29 NOTE — Telephone Encounter (Signed)
Completed and placed in Tamika's box. 

## 2015-09-07 ENCOUNTER — Other Ambulatory Visit: Payer: Self-pay

## 2015-09-07 DIAGNOSIS — Z1231 Encounter for screening mammogram for malignant neoplasm of breast: Secondary | ICD-10-CM

## 2015-09-09 ENCOUNTER — Other Ambulatory Visit: Payer: Self-pay | Admitting: Family Medicine

## 2015-09-12 ENCOUNTER — Other Ambulatory Visit: Payer: Self-pay | Admitting: Family Medicine

## 2015-09-25 ENCOUNTER — Ambulatory Visit: Payer: Medicare Other

## 2015-10-09 ENCOUNTER — Other Ambulatory Visit: Payer: Self-pay | Admitting: Family Medicine

## 2015-10-14 ENCOUNTER — Other Ambulatory Visit: Payer: Self-pay | Admitting: Family Medicine

## 2015-10-20 ENCOUNTER — Ambulatory Visit (INDEPENDENT_AMBULATORY_CARE_PROVIDER_SITE_OTHER): Payer: Medicare Other | Admitting: Family Medicine

## 2015-10-20 ENCOUNTER — Encounter: Payer: Self-pay | Admitting: Family Medicine

## 2015-10-20 VITALS — BP 132/58 | HR 65 | Temp 98.4°F | Wt 264.9 lb

## 2015-10-20 DIAGNOSIS — Z1159 Encounter for screening for other viral diseases: Secondary | ICD-10-CM | POA: Diagnosis not present

## 2015-10-20 DIAGNOSIS — E785 Hyperlipidemia, unspecified: Secondary | ICD-10-CM

## 2015-10-20 DIAGNOSIS — I10 Essential (primary) hypertension: Secondary | ICD-10-CM | POA: Diagnosis not present

## 2015-10-20 DIAGNOSIS — N393 Stress incontinence (female) (male): Secondary | ICD-10-CM | POA: Diagnosis not present

## 2015-10-20 DIAGNOSIS — N83201 Unspecified ovarian cyst, right side: Secondary | ICD-10-CM | POA: Diagnosis not present

## 2015-10-20 DIAGNOSIS — Z0000 Encounter for general adult medical examination without abnormal findings: Secondary | ICD-10-CM

## 2015-10-20 DIAGNOSIS — R55 Syncope and collapse: Secondary | ICD-10-CM

## 2015-10-20 DIAGNOSIS — M25561 Pain in right knee: Secondary | ICD-10-CM

## 2015-10-20 LAB — LIPID PANEL
CHOL/HDL RATIO: 4.6 ratio (ref <=5.0)
Cholesterol: 212 mg/dL — ABNORMAL HIGH (ref 125–200)
HDL: 46 mg/dL (ref >=46)
LDL CALC: 143 mg/dL — AB (ref <130)
Triglycerides: 115 mg/dL (ref <150)
VLDL: 23 mg/dL (ref <30)

## 2015-10-20 LAB — POCT GLYCOSYLATED HEMOGLOBIN (HGB A1C): HEMOGLOBIN A1C: 5.6

## 2015-10-20 MED ORDER — TRAMADOL HCL 50 MG PO TABS
50.0000 mg | ORAL_TABLET | Freq: Two times a day (BID) | ORAL | Status: DC | PRN
Start: 2015-10-20 — End: 2015-11-07

## 2015-10-20 MED ORDER — HYDROCHLOROTHIAZIDE 25 MG PO TABS: 25.0000 mg | ORAL_TABLET | Freq: Every day | ORAL | Status: DC

## 2015-10-20 NOTE — Assessment & Plan Note (Signed)
Checking Hep c.  Lipid panel. FU in 3 months.

## 2015-10-20 NOTE — Assessment & Plan Note (Signed)
Still present, but much better having lost weight. Commended her on this.   Plan is to do tramadol at night before bed rather than in the middle of the afternoon to help her sleep at night.

## 2015-10-20 NOTE — Assessment & Plan Note (Signed)
Much better since losing weight. Has lost 65 lbs in past 3 years.  10 lbs this year.  Goal is 65 more labs next year.   Doing kegel exercises at home.  Continues water aerobics and abdominal strenghtening.

## 2015-10-20 NOTE — Assessment & Plan Note (Signed)
Checking CA-125 per previous oncologist.  She will hold on the pelvic MRI for now.

## 2015-10-20 NOTE — Patient Instructions (Addendum)
It was good to see you today.  Blood pressure was good.  Go down just to HCTZ by itself.    Checking labs today.  Take the Diclofenac in the PM and Tramadol at night   You're doing fantastic!!!  Keep up the great work with your activity.
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
## 2015-10-20 NOTE — Assessment & Plan Note (Signed)
Improving with loss of weight.   Had BP of 100 systolic while at dentist last week.   Goal is <150 systolic.   Do not see compelling indication for ACE-I.  Stop ACE-HCTZ combo, just continue with HCTZ. FU BP recheck in 3 weeks or so.

## 2015-10-20 NOTE — Assessment & Plan Note (Signed)
Strange presentation.  Unclear cause for perception of her throat closing up and cutting off her airway, and why this would so suddenly resolve. Will send to ENT for further recommendations and to see if laryngoscopy is necessary.   Exam good today, but I could not visualize her posterior oropharynx.

## 2015-10-21 LAB — CA 125: CA 125: 8 U/mL (ref <35)

## 2015-10-21 LAB — HEPATITIS C ANTIBODY: HCV Ab: NEGATIVE

## 2015-10-27 ENCOUNTER — Encounter: Payer: Self-pay | Admitting: Family Medicine

## 2015-11-01 ENCOUNTER — Other Ambulatory Visit: Payer: Self-pay | Admitting: *Deleted

## 2015-11-07 ENCOUNTER — Other Ambulatory Visit: Payer: Self-pay | Admitting: *Deleted

## 2015-11-08 MED ORDER — DICLOFENAC SODIUM 75 MG PO TBEC: 75.0000 mg | DELAYED_RELEASE_TABLET | Freq: Two times a day (BID) | ORAL | Status: DC

## 2015-11-08 MED ORDER — METOPROLOL SUCCINATE ER 25 MG PO TB24
25.0000 mg | ORAL_TABLET | Freq: Every day | ORAL | Status: DC
Start: 2015-11-08 — End: 2016-03-28

## 2015-11-08 MED ORDER — TRAMADOL HCL 50 MG PO TABS: 50.0000 mg | ORAL_TABLET | Freq: Two times a day (BID) | ORAL | Status: DC | PRN

## 2015-11-08 MED ORDER — HYDROCHLOROTHIAZIDE 25 MG PO TABS: 25.0000 mg | ORAL_TABLET | Freq: Every day | ORAL | Status: DC

## 2015-12-15 ENCOUNTER — Ambulatory Visit (INDEPENDENT_AMBULATORY_CARE_PROVIDER_SITE_OTHER): Payer: Medicare Other | Admitting: *Deleted

## 2015-12-15 VITALS — BP 138/84 | HR 74 | Wt 255.3 lb

## 2015-12-15 DIAGNOSIS — Z136 Encounter for screening for cardiovascular disorders: Secondary | ICD-10-CM

## 2015-12-15 DIAGNOSIS — Z0130 Encounter for examination of blood pressure without abnormal findings: Secondary | ICD-10-CM

## 2015-12-15 NOTE — Progress Notes (Signed)
   Patient in nurse clinic for blood pressure check.  Pt denies any SOB, weakness, chest pain, headache, dizziness or visual changes.  Patient stated she wanted her blood pressure check because she was taken off the Lisinopril.  Patient did take all other medications as prescribed.  Patient is going to the [facility] 5 days a week from 9-1.  Will forward to PCP.  Clovis Pu, RN  Today's Vitals   12/15/15 0945  BP: 138/84  Pulse: 74  Weight: 255 lb 4.8 oz (115.803 kg)  SpO2: 96%  PainSc: 0-No pain

## 2016-01-16 ENCOUNTER — Ambulatory Visit (INDEPENDENT_AMBULATORY_CARE_PROVIDER_SITE_OTHER): Payer: Medicare Other | Admitting: *Deleted

## 2016-01-16 VITALS — BP 138/70 | HR 65

## 2016-01-16 DIAGNOSIS — Z136 Encounter for screening for cardiovascular disorders: Secondary | ICD-10-CM

## 2016-01-16 DIAGNOSIS — Z0130 Encounter for examination of blood pressure without abnormal findings: Secondary | ICD-10-CM

## 2016-01-16 NOTE — Progress Notes (Signed)
   Patient in nurse clinic for blood pressure check.  Patient stated she had dizziness and headache in past few days.  Today she it is more of mild headache.  She has been taking 81 mg of baby aspirin in the past few days per patient to prevent a stroke.  Patient stated she did take her blood pressure medications as prescribed.  Patient has appointment with PCP on Tuesday 01/23/16.  Will forward blood pressure reading to PCP.  Clovis PuMartin, Tamika L, RN  Today's Vitals   01/16/16 0911 01/16/16 0919  BP: 150/84 138/70  Pulse: 65   SpO2: 97%

## 2016-01-23 ENCOUNTER — Ambulatory Visit (INDEPENDENT_AMBULATORY_CARE_PROVIDER_SITE_OTHER): Payer: Medicare Other | Admitting: Family Medicine

## 2016-01-23 ENCOUNTER — Encounter: Payer: Self-pay | Admitting: Family Medicine

## 2016-01-23 VITALS — BP 141/78 | HR 65 | Temp 98.5°F | Ht 66.0 in | Wt 268.0 lb

## 2016-01-23 DIAGNOSIS — I10 Essential (primary) hypertension: Secondary | ICD-10-CM

## 2016-01-23 DIAGNOSIS — G44219 Episodic tension-type headache, not intractable: Secondary | ICD-10-CM
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
## 2016-01-23 NOTE — Patient Instructions (Addendum)
Give me a call to let me know what was in the vitamin you took.  If you start having headaches again, CALL and let me know.  Gentle yoga is fine.    It was good to see you again today!

## 2016-01-25 ENCOUNTER — Other Ambulatory Visit: Payer: Self-pay | Admitting: Family Medicine

## 2016-01-25 ENCOUNTER — Telehealth: Payer: Self-pay | Admitting: Family Medicine

## 2016-01-25 DIAGNOSIS — R51 Headache: Secondary | ICD-10-CM

## 2016-01-25 DIAGNOSIS — R519 Headache, unspecified: Secondary | ICD-10-CM | POA: Insufficient documentation

## 2016-01-25 NOTE — Assessment & Plan Note (Signed)
Sounds mostly like tension-type headache. Occipital and frontal regions. Relieved with over-the-counter analgesics. Long conversation that these are not indicative of stroke symptoms. She has no other symptoms concerning for me for stroke. Reassurance provided. Told to stop taking aspirin immediately except for her 81 mg daily dose. She was taking the increased dose of aspirin plus Excedrin which contains aspirin plus her daily 81 mg of aspirin. She denied any bleeding episodes. Did discuss that she will likely rebound headache for the next 24-48 hours she has been scheduling her Excedrin and should be stopping this today. She is to follow-up if headache recurs after 24-48 hours. Also to follow-up headache comes back stronger usual or if she begins to have any other related symptoms.

## 2016-01-25 NOTE — Assessment & Plan Note (Signed)
For now she feels more comfortable on combination lisinopril/HCTZ. Her blood pressure is good today. It is not too low. She's not had any falls or hypotension. Plan to continue on this regimen for now.

## 2016-01-25 NOTE — Telephone Encounter (Signed)
MD place it in to be faxed.   [facility] Fleeger, Maryjo RochesterJessica Dawn, CMA

## 2016-02-12 ENCOUNTER — Other Ambulatory Visit: Payer: Self-pay | Admitting: Family Medicine

## 2016-03-15 ENCOUNTER — Other Ambulatory Visit: Payer: Self-pay | Admitting: Family Medicine

## 2016-03-15 NOTE — Telephone Encounter (Signed)
Please check with patient to make sure she is still taking metoprolol.  I will refill if she is  Last note in March mentioned she was taking lisinopril/hctz although this is not on her medication list   Thanks  LC

## 2016-03-15 NOTE — Telephone Encounter (Signed)
Pt called and said that she needed 3 pills of Metoprolol called in. She has a 90 day prescription she can pick up on Monday. jw

## 2016-03-15 NOTE — Telephone Encounter (Signed)
LM for patient with message from MD.  Asked her to call back and clarify which medication she is actually taking. Jazmin Hartsell,CMA

## 2016-03-18 ENCOUNTER — Ambulatory Visit: Payer: Medicare Other

## 2016-03-28 ENCOUNTER — Other Ambulatory Visit: Payer: Self-pay | Admitting: Family Medicine

## 2016-03-28 ENCOUNTER — Other Ambulatory Visit: Payer: Self-pay | Admitting: *Deleted

## 2016-03-28 MED ORDER — LISINOPRIL-HYDROCHLOROTHIAZIDE 20-25 MG PO TABS: 1.0000 | ORAL_TABLET | Freq: Every day | ORAL | Status: DC

## 2016-03-28 MED ORDER — METOPROLOL SUCCINATE ER 25 MG PO TB24: 25.0000 mg | ORAL_TABLET | Freq: Every day | ORAL | Status: DC

## 2016-03-28 MED ORDER — TRAMADOL HCL 50 MG PO TABS: ORAL_TABLET | ORAL | Status: DC

## 2016-03-28 NOTE — Telephone Encounter (Signed)
Ms. Paige Jenkins need to speak with nurse regarding refill request for meds sent to provider incorrectly.   Please return call to get information regarding medication and change of pharmacy

## 2016-03-28 NOTE — Telephone Encounter (Signed)
Return call to regarding medications and up date pharmacy.  Patient stated she did not receive her medication refill for metoprolol XR 25 mg that was [facility] [address].  She also needed a refill on lisinopril-HCTZ.  Please advise.  Clovis PuMartin, Tamika L, RN

## 2016-05-11 ENCOUNTER — Other Ambulatory Visit: Payer: Self-pay | Admitting: Family Medicine

## 2016-05-16 ENCOUNTER — Other Ambulatory Visit: Payer: Self-pay | Admitting: Family Medicine

## 2016-05-28 ENCOUNTER — Encounter: Payer: Self-pay | Admitting: Family Medicine

## 2016-05-28 ENCOUNTER — Ambulatory Visit (INDEPENDENT_AMBULATORY_CARE_PROVIDER_SITE_OTHER): Payer: Medicare Other | Admitting: Family Medicine

## 2016-05-28 ENCOUNTER — Other Ambulatory Visit (HOSPITAL_COMMUNITY): Payer: Self-pay | Admitting: Psychiatry

## 2016-05-28 VITALS — BP 138/62 | HR 58 | Temp 97.8°F | Wt 260.6 lb

## 2016-05-28 DIAGNOSIS — R208 Other disturbances of skin sensation: Secondary | ICD-10-CM | POA: Diagnosis not present

## 2016-05-28 DIAGNOSIS — R200 Anesthesia of skin: Secondary | ICD-10-CM
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
## 2016-05-28 NOTE — Patient Instructions (Addendum)
You're doing fantastic!  The numbness is a nerve issue.    Your blood pressure also looks good today, which is great.    Make sure that you schedule your mammogram in the next couple of months.

## 2016-05-30 DIAGNOSIS — R200 Anesthesia of skin: Secondary | ICD-10-CM | POA: Insufficient documentation

## 2016-05-30 NOTE — Assessment & Plan Note (Signed)
Most likely secondary from repetitive trauma from walking outside using walker.   Arm numbness resolved as soon as she finished using walker.  Blood pressure has been good.  No concern for stroke, no other focal deficits.   Had surgery on Left hand after fracturing radius/ulnar several years ago.  Injured ulnar and radial nerves then.  Likely exacerbated current issue.  Especially in light of hypothenar atrophy.

## 2016-08-11 ENCOUNTER — Other Ambulatory Visit: Payer: Self-pay | Admitting: Family Medicine

## 2016-08-20 ENCOUNTER — Ambulatory Visit: Payer: Medicare Other | Admitting: Family Medicine

## 2016-09-20 ENCOUNTER — Telehealth: Payer: Self-pay | Admitting: Family Medicine

## 2016-09-27 ENCOUNTER — Other Ambulatory Visit: Payer: Self-pay | Admitting: Family Medicine

## 2016-09-30 NOTE — Telephone Encounter (Signed)
Please call this in.  I couldn't tell if this was already done from the messages.  If it's been done, please ignore this.  Thanks!  JW

## 2016-09-30 NOTE — Telephone Encounter (Signed)
Rx called in. Pt informed. Sunday SpillersSharon T Payden Docter, CMA

## 2016-09-30 NOTE — Telephone Encounter (Signed)
Patient states that [place] and was unable to pick up her tramadol before going out of town.  She would like for the provider or nurse to [facility] [place] [phone] because [facility] is [facility] to [facility].  Will forward to white team. Burnard HawthorneJazmin Hartsell,CMA

## 2016-10-09 ENCOUNTER — Other Ambulatory Visit: Payer: Self-pay | Admitting: Family Medicine

## 2016-10-09 DIAGNOSIS — Z1231 Encounter for screening mammogram for malignant neoplasm of breast: Secondary | ICD-10-CM

## 2016-10-16 ENCOUNTER — Ambulatory Visit: Payer: Medicare Other

## 2016-10-23 ENCOUNTER — Encounter: Payer: Self-pay | Admitting: *Deleted

## 2016-10-23 ENCOUNTER — Ambulatory Visit (INDEPENDENT_AMBULATORY_CARE_PROVIDER_SITE_OTHER): Payer: Medicare Other | Admitting: *Deleted

## 2016-10-23 VITALS — BP 124/60 | HR 64 | Temp 98.2°F | Ht 66.75 in | Wt 259.0 lb

## 2016-10-23 DIAGNOSIS — Z0000 Encounter for general adult medical examination without abnormal findings: Secondary | ICD-10-CM | POA: Diagnosis not present
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
## 2016-10-23 NOTE — Patient Instructions (Signed)
Fall Prevention in the Home Introduction Falls can cause injuries. They can happen to people of all ages. There are many things you can do to make your home safe and to help prevent falls. What can I do on the outside of my home?  Regularly fix the edges of walkways and driveways and fix any cracks.  Remove anything that might make you trip as you walk through a door, such as a raised step or threshold.  Trim any bushes or trees on the path to your home.  Use bright outdoor lighting.  Clear any walking paths of anything that might make someone trip, such as rocks or tools.  Regularly check to see if handrails are loose or broken. Make sure that both sides of any steps have handrails.  Any raised decks and porches should have guardrails on the edges.  Have any leaves, snow, or ice cleared regularly.  Use sand or salt on walking paths during winter.  Clean up any spills in your garage right away. This includes oil or grease spills. What can I do in the bathroom?  Use night lights.  Install grab bars by the toilet and in the tub and shower. Do not use towel bars as grab bars.  Use non-skid mats or decals in the tub or shower.  If you need to sit down in the shower, use a plastic, non-slip stool.  Keep the floor dry. Clean up any water that spills on the floor as soon as it happens.  Remove soap buildup in the tub or shower regularly.  Attach bath mats securely with double-sided non-slip rug tape.  Do not have throw rugs and other things on the floor that can make you trip. What can I do in the bedroom?  Use night lights.  Make sure that you have a light by your bed that is easy to reach.  Do not use any sheets or blankets that are too big for your bed. They should not hang down onto the floor.  Have a firm chair that has side arms. You can use this for support while you get dressed.  Do not have throw rugs and other things on the floor that can make you trip. What  can I do in the kitchen?  Clean up any spills right away.  Avoid walking on wet floors.  Keep items that you use a lot in easy-to-reach places.  If you need to reach something above you, use a strong step stool that has a grab bar.  Keep electrical cords out of the way.  Do not use floor polish or wax that makes floors slippery. If you must use wax, use non-skid floor wax.  Do not have throw rugs and other things on the floor that can make you trip. What can I do with my stairs?  Do not leave any items on the stairs.  Make sure that there are handrails on both sides of the stairs and use them. Fix handrails that are broken or loose. Make sure that handrails are as long as the stairways.  Check any carpeting to make sure that it is firmly attached to the stairs. Fix any carpet that is loose or worn.  Avoid having throw rugs at the top or bottom of the stairs. If you do have throw rugs, attach them to the floor with carpet tape.  Make sure that you have a light switch at the top of the stairs and the bottom of the stairs. If  you do not have them, ask someone to add them for you. What else can I do to help prevent falls?  Wear shoes that:  Do not have high heels.  Have rubber bottoms.  Are comfortable and fit you well.  Are closed at the toe. Do not wear sandals.  If you use a stepladder:  Make sure that it is fully opened. Do not climb a closed stepladder.  Make sure that both sides of the stepladder are locked into place.  Ask someone to hold it for you, if possible.  Clearly mark and make sure that you can see:  Any grab bars or handrails.  First and last steps.  Where the edge of each step is.  Use tools that help you move around (mobility aids) if they are needed. These include:  Canes.  Walkers.  Scooters.  Crutches.  Turn on the lights when you go into a dark area. Replace any light bulbs as soon as they burn out.  Set up your furniture so you  have a clear path. Avoid moving your furniture around.  If any of your floors are uneven, fix them.  If there are any pets around you, be aware of where they are.  Review your medicines with your doctor. Some medicines can make you feel dizzy. This can increase your chance of falling. Ask your doctor what other things that you can do to help prevent falls. This information is not intended to replace advice given to you by your health care provider. Make sure you discuss any questions you have with your health care provider. Document Released: 08/24/2009 Document Revised: 04/04/2016 Document Reviewed: 12/02/2014  2017 Elsevier  Health Maintenance, Female Introduction Adopting a healthy lifestyle and getting preventive care can go a long way to promote health and wellness. Talk with your health care provider about what schedule of regular examinations is right for you. This is a good chance for you to check in with your provider about disease prevention and staying healthy. In between checkups, there are plenty of things you can do on your own. Experts have done a lot of research about which lifestyle changes and preventive measures are most likely to keep you healthy. Ask your health care provider for more information. Weight and diet Eat a healthy diet  Be sure to include plenty of vegetables, fruits, low-fat dairy products, and lean protein.  Do not eat a lot of foods high in solid fats, added sugars, or salt.  Get regular exercise. This is one of the most important things you can do for your health.  Most adults should exercise for at least 150 minutes each week. The exercise should increase your heart rate and make you sweat (moderate-intensity exercise).  Most adults should also do strengthening exercises at least twice a week. This is in addition to the moderate-intensity exercise. Maintain a healthy weight  Body mass index (BMI) is a measurement that can be used to identify  possible weight problems. It estimates body fat based on height and weight. Your health care provider can help determine your BMI and help you achieve or maintain a healthy weight.  For females 20 years of age and older:  A BMI below 18.5 is considered underweight.  A BMI of 18.5 to 24.9 is normal.  A BMI of 25 to 29.9 is considered overweight.  A BMI of 30 and above is considered obese. Watch levels of cholesterol and blood lipids  You should start having your blood tested   for lipids and cholesterol at 63 years of age, then have this test every 5 years.  You may need to have your cholesterol levels checked more often if:  Your lipid or cholesterol levels are high.  You are older than 63 years of age.  You are at high risk for heart disease. Cancer screening Lung Cancer  Lung cancer screening is recommended for adults 25-63 years old who are at high risk for lung cancer because of a history of smoking.  A yearly low-dose CT scan of the lungs is recommended for people who:  Currently smoke.  Have quit within the past 15 years.  Have at least a 30-pack-year history of smoking. A pack year is smoking an average of one pack of cigarettes a day for 1 year.  Yearly screening should continue until it has been 15 years since you quit.  Yearly screening should stop if you develop a health problem that would prevent you from having lung cancer treatment. Breast Cancer  Practice breast self-awareness. This means understanding how your breasts normally appear and feel.  It also means doing regular breast self-exams. Let your health care provider know about any changes, no matter how small.  If you are in your 20s or 30s, you should have a clinical breast exam (CBE) by a health care provider every 1-3 years as part of a regular health exam.  If you are 10 or older, have a CBE every year. Also consider having a breast X-ray (mammogram) every year.  If you have a family history of  breast cancer, talk to your health care provider about genetic screening.  If you are at high risk for breast cancer, talk to your health care provider about having an MRI and a mammogram every year.  Breast cancer gene (BRCA) assessment is recommended for women who have family members with BRCA-related cancers. BRCA-related cancers include:  Breast.  Ovarian.  Tubal.  Peritoneal cancers.  Results of the assessment will determine the need for genetic counseling and BRCA1 and BRCA2 testing. Cervical Cancer  Your health care provider may recommend that you be screened regularly for cancer of the pelvic organs (ovaries, uterus, and vagina). This screening involves a pelvic examination, including checking for microscopic changes to the surface of your cervix (Pap test). You may be encouraged to have this screening done every 3 years, beginning at age 45.  For women ages 73-65, health care providers may recommend pelvic exams and Pap testing every 3 years, or they may recommend the Pap and pelvic exam, combined with testing for human papilloma virus (HPV), every 5 years. Some types of HPV increase your risk of cervical cancer. Testing for HPV may also be done on women of any age with unclear Pap test results.  Other health care providers may not recommend any screening for nonpregnant women who are considered low risk for pelvic cancer and who do not have symptoms. Ask your health care provider if a screening pelvic exam is right for you.  If you have had past treatment for cervical cancer or a condition that could lead to cancer, you need Pap tests and screening for cancer for at least 20 years after your treatment. If Pap tests have been discontinued, your risk factors (such as having a new sexual partner) need to be reassessed to determine if screening should resume. Some women have medical problems that increase the chance of getting cervical cancer. In these cases, your health care provider may  recommend more frequent  screening and Pap tests. Colorectal Cancer  This type of cancer can be detected and often prevented.  Routine colorectal cancer screening usually begins at 63 years of age and continues through 63 years of age.  Your health care provider may recommend screening at an earlier age if you have risk factors for colon cancer.  Your health care provider may also recommend using home test kits to check for hidden blood in the stool.  A small camera at the end of a tube can be used to examine your colon directly (sigmoidoscopy or colonoscopy). This is done to check for the earliest forms of colorectal cancer.  Routine screening usually begins at age 50.  Direct examination of the colon should be repeated every 5-10 years through 63 years of age. However, you may need to be screened more often if early forms of precancerous polyps or small growths are found. Skin Cancer  Check your skin from head to toe regularly.  Tell your health care provider about any new moles or changes in moles, especially if there is a change in a mole's shape or color.  Also tell your health care provider if you have a mole that is larger than the size of a pencil eraser.  Always use sunscreen. Apply sunscreen liberally and repeatedly throughout the day.  Protect yourself by wearing long sleeves, pants, a wide-brimmed hat, and sunglasses whenever you are outside. Heart disease, diabetes, and high blood pressure  High blood pressure causes heart disease and increases the risk of stroke. High blood pressure is more likely to develop in:  People who have blood pressure in the high end of the normal range (130-139/85-89 mm Hg).  People who are overweight or obese.  People who are African American.  If you are 18-39 years of age, have your blood pressure checked every 3-5 years. If you are 40 years of age or older, have your blood pressure checked every year. You should have your blood pressure  measured twice-once when you are at a hospital or clinic, and once when you are not at a hospital or clinic. Record the average of the two measurements. To check your blood pressure when you are not at a hospital or clinic, you can use:  An automated blood pressure machine at a pharmacy.  A home blood pressure monitor.  If you are between 55 years and 79 years old, ask your health care provider if you should take aspirin to prevent strokes.  Have regular diabetes screenings. This involves taking a blood sample to check your fasting blood sugar level.  If you are at a normal weight and have a low risk for diabetes, have this test once every three years after 63 years of age.  If you are overweight and have a high risk for diabetes, consider being tested at a younger age or more often. Preventing infection Hepatitis B  If you have a higher risk for hepatitis B, you should be screened for this virus. You are considered at high risk for hepatitis B if:  You were born in a country where hepatitis B is common. Ask your health care provider which countries are considered high risk.  Your parents were born in a high-risk country, and you have not been immunized against hepatitis B (hepatitis B vaccine).  You have HIV or AIDS.  You use needles to inject street drugs.  You live with someone who has hepatitis B.  You have had sex with someone who has hepatitis B.    You get hemodialysis treatment.  You take certain medicines for conditions, including cancer, organ transplantation, and autoimmune conditions. Hepatitis C  Blood testing is recommended for:  Everyone born from [birth year] through 1963.  Anyone with known risk factors for hepatitis C. Sexually transmitted infections (STIs)  You should be screened for sexually transmitted infections (STIs) including gonorrhea and chlamydia if:  You are sexually active and are younger than 63 years of age.  You are older than 63 years of age and  your health care provider tells you that you are at risk for this type of infection.  Your sexual activity has changed since you were last screened and you are at an increased risk for chlamydia or gonorrhea. Ask your health care provider if you are at risk.  If you do not have HIV, but are at risk, it may be recommended that you take a prescription medicine daily to prevent HIV infection. This is called pre-exposure prophylaxis (PrEP). You are considered at risk if:  You are sexually active and do not regularly use condoms or know the HIV status of your partner(s).  You take drugs by injection.  You are sexually active with a partner who has HIV. Talk with your health care provider about whether you are at high risk of being infected with HIV. If you choose to begin PrEP, you should first be tested for HIV. You should then be tested every 3 months for as long as you are taking PrEP. Pregnancy  If you are premenopausal and you may become pregnant, ask your health care provider about preconception counseling.  If you may become pregnant, take 400 to 800 micrograms (mcg) of folic acid every day.  If you want to prevent pregnancy, talk to your health care provider about birth control (contraception). Osteoporosis and menopause  Osteoporosis is a disease in which the bones lose minerals and strength with aging. This can result in serious bone fractures. Your risk for osteoporosis can be identified using a bone density scan.  If you are 5 years of age or older, or if you are at risk for osteoporosis and fractures, ask your health care provider if you should be screened.  Ask your health care provider whether you should take a calcium or vitamin D supplement to lower your risk for osteoporosis.  Menopause may have certain physical symptoms and risks.  Hormone replacement therapy may reduce some of these symptoms and risks. Talk to your health care provider about whether hormone replacement  therapy is right for you. Follow these instructions at home:  Schedule regular health, dental, and eye exams.  Stay current with your immunizations.  Do not use any tobacco products including cigarettes, chewing tobacco, or electronic cigarettes.  If you are pregnant, do not drink alcohol.  If you are breastfeeding, limit how much and how often you drink alcohol.  Limit alcohol intake to no more than 1 drink per day for nonpregnant women. One drink equals 12 ounces of beer, 5 ounces of wine, or 1 ounces of hard liquor.  Do not use street drugs.  Do not share needles.  Ask your health care provider for help if you need support or information about quitting drugs.  Tell your health care provider if you often feel depressed.  Tell your health care provider if you have ever been abused or do not feel safe at home. This information is not intended to replace advice given to you by your health care provider. Make sure you discuss any  questions you have with your health care provider. Document Released: 05/13/2011 Document Revised: 04/04/2016 Document Reviewed: 08/01/2015  2017 Elsevier   Hearing Loss Introduction Hearing loss is a partial or total loss of the ability to hear. This can be temporary or permanent, and it can happen in one or both ears. Hearing loss may be referred to as deafness. Medical care is necessary to treat hearing loss properly and to prevent the condition from getting worse. Your hearing may partially or completely come back, depending on what caused your hearing loss and how severe it is. In some cases, hearing loss is permanent. What are the causes? Common causes of hearing loss include:  Too much wax in the ear canal.  Infection of the ear canal or middle ear.  Fluid in the middle ear.  Injury to the ear or surrounding area.  An object stuck in the ear.  Prolonged exposure to loud sounds, such as music. Less common causes of hearing loss  include:  Tumors in the ear.  Viral or bacterial infections, such as meningitis.  A hole in the eardrum (perforated eardrum).  Problems with the hearing nerve that sends signals between the brain and the ear.  Certain medicines. What are the signs or symptoms? Symptoms of this condition may include:  Difficulty telling the difference between sounds.  Difficulty following a conversation when there is background noise.  Lack of response to sounds in your environment. This may be most noticeable when you do not respond to startling sounds.  Needing to turn up the volume on the television, radio, etc.  Ringing in the ears.  Dizziness.  Pain in the ears. How is this diagnosed? This condition is diagnosed based on a physical exam and a hearing test (audiometry). The audiometry test will be performed by a hearing specialist (audiologist). You may also be referred to an ear, nose, and throat (ENT) specialist (otolaryngologist). How is this treated? Treatment for recent onset of hearing loss may include:  Ear wax removal.  Being prescribed medicines to prevent infection (antibiotics).  Being prescribed medicines to reduce inflammation (corticosteroids). Follow these instructions at home:  If you were prescribed an antibiotic medicine, take it as told by your health care provider. Do not stop taking the antibiotic even if you start to feel better.  Take over-the-counter and prescription medicines only as told by your health care provider.  Avoid loud noises.  Return to your normal activities as told by your health care provider. Ask your health care provider what activities are safe for you.  Keep all follow-up visits as told by your health care provider. This is important. Contact a health care provider if:  You feel dizzy.  You develop new symptoms.  You vomit or feel nauseous.  You have a fever. Get help right away if:  You develop sudden changes in your  vision.  You have severe ear pain.  You have new or increased weakness.  You have a severe headache. This information is not intended to replace advice given to you by your health care provider. Make sure you discuss any questions you have with your health care provider. Document Released: 10/28/2005 Document Revised: 04/04/2016 Document Reviewed: 03/15/2015  2017 Elsevier

## 2016-10-24 ENCOUNTER — Encounter: Payer: Self-pay | Admitting: *Deleted

## 2016-10-24 NOTE — Progress Notes (Signed)
I have reviewed this visit and discussed with Lauren Ducatte, RN, BSN, and agree with her documentation.   

## 2016-10-31 ENCOUNTER — Ambulatory Visit (INDEPENDENT_AMBULATORY_CARE_PROVIDER_SITE_OTHER): Payer: Medicare Other | Admitting: Internal Medicine

## 2016-10-31 DIAGNOSIS — R05 Cough: Secondary | ICD-10-CM | POA: Diagnosis not present

## 2016-10-31 DIAGNOSIS — R059 Cough, unspecified: Secondary | ICD-10-CM | POA: Insufficient documentation

## 2016-10-31 MED ORDER — ALBUTEROL SULFATE HFA 108 (90 BASE) MCG/ACT IN AERS: 2.0000 | INHALATION_SPRAY | Freq: Four times a day (QID) | RESPIRATORY_TRACT | 0 refills | Status: DC | PRN

## 2016-10-31 MED ORDER — AZITHROMYCIN 250 MG PO TABS: ORAL_TABLET | ORAL | 0 refills | Status: DC

## 2016-10-31 NOTE — Patient Instructions (Signed)
Take the Azithromycin for a secondary bacterial infection. Use the albuterol inhaler for shortness of breath with your cough.   If symptoms do not improve despite the antibiotic, please let us know.

## 2016-10-31 NOTE — Progress Notes (Signed)
   Subjective:    Paige Jenkins - 65 y.o. female MRN [id number]  Date of birth: 10/30/1951  HPI  Paige Jenkins is here for SDA for cough.  COUGH  Has been coughing for >14 days. Cough was improving but suddenly worsened 5 days ago.  Cough is: severe.  Sputum production: yes, green  Medications tried: cough drops, tylenol and OTC cold medications  Taking blood pressure medications: no   Symptoms Runny nose: yes  Mucous in back of throat: yes Throat burning or reflux: no Wheezing or asthma: Audible wheezing. No history of asthma.  Fever: No  Chest Pain: No Shortness of breath: Yes, mostly at night with cough  Leg swelling: No  Hemoptysis: No Weight loss: No   ROS see HPI Smoking Status noted -  reports that she has never smoked. She has never used smokeless tobacco.  - Past Medical History: Patient Active Problem List   Diagnosis Date Noted  . Cough 10/31/2016  . Hand numbness 05/30/2016  . Headache 01/25/2016  . Syncope 10/20/2015  . Stress incontinence 11/01/2014  . Contusion of unspecified site 06/10/2014  . Trochanteric bursitis of right hip 10/20/2013  . Right knee pain 10/20/2013  . Ovarian cyst 05/20/2012  . Right ankle pain 04/23/2012  . Hypertension 04/23/2012  . Depression 04/23/2012  . Preventative health care 04/23/2012   - Medications: reviewed and updated   Objective:   Physical Exam BP 118/62   Pulse 75   Temp 98.1 F (36.7 C) (Oral)   Wt 262 lb (118.8 kg)   SpO2 99%   BMI 41.34 kg/m  Gen: NAD, alert, cooperative with exam, appears ill but non-toxic  HEENT: NCAT, PERRL, clear conjunctiva, oropharynx clear, supple neck, TM normal bilaterally, no TTP over maxillary or frontal sinuses  CV: RRR, good S1/S2, no murmur, no edema Resp: audible wheezing, some SOB with speaking but can finish sentences, no tachypnea, lungs with rhonchi but clear with cough bilaterally     Assessment & Plan:   Cough Concerned for secondary bacterial  infection given sudden worsening of cough. Will treat with Azithromycin. Albuterol given for SOB. Recommended OTC decongestants and nasal saline spray. Low suspicion for PNA now due to lack of fever, lack of hypoxia, and equal breath sounds bilaterally. If cough worsens despite antibiotic, would obtain CXR. Return precautions discussed with patient.     Marcy Sirenatherine Lyncoln Ledgerwood, D.O. 10/31/2016, 4:50 PM PGY-2, [facility]

## 2016-10-31 NOTE — Assessment & Plan Note (Addendum)
Suspect patient initially had vital URI. Concerned for secondary bacterial infection given sudden worsening of cough. Will treat with Azithromycin. Albuterol given for SOB. Recommended OTC decongestants and nasal saline spray. Low suspicion for PNA now due to lack of fever, lack of hypoxia, and equal breath sounds bilaterally. If cough worsens despite antibiotic, would obtain CXR. Return precautions discussed with patient.

## 2016-12-03 ENCOUNTER — Other Ambulatory Visit: Payer: Self-pay | Admitting: Family Medicine

## 2016-12-03 NOTE — Telephone Encounter (Signed)
Needs refills on : metrolpolol, HCTZ, diclofenac.  [facility] for [facility] 3 month supply because there is no copay.   [phone] phone number

## 2016-12-04 MED ORDER — LISINOPRIL-HYDROCHLOROTHIAZIDE 20-25 MG PO TABS: 1.0000 | ORAL_TABLET | Freq: Every day | ORAL | 2 refills | Status: DC

## 2016-12-04 MED ORDER — METOPROLOL SUCCINATE ER 25 MG PO TB24: 25.0000 mg | ORAL_TABLET | Freq: Every day | ORAL | 2 refills | Status: DC

## 2016-12-04 MED ORDER — DICLOFENAC SODIUM 75 MG PO TBEC: 75.0000 mg | DELAYED_RELEASE_TABLET | Freq: Two times a day (BID) | ORAL | 6 refills | Status: DC | PRN

## 2016-12-04 NOTE — Telephone Encounter (Signed)
[facility]  Would you be able to call thepharmacy and ask their address to make sure it's the right one?  Thanks!  JW

## 2016-12-04 NOTE — Telephone Encounter (Signed)
[facility] [address] [place], [place] [place] is the correct address. It is also the one in her chart. Sunday SpillersSharon T Ricahrd Schwager, CMA

## 2016-12-04 NOTE — Telephone Encounter (Signed)
Thanks!  I put in [facility] by accident, which pulled up a bunch of different pharmacies.  I have sent in these medicines for her.

## 2016-12-04 NOTE — Telephone Encounter (Signed)
Pt informed. Sharon T Saunders, CMA  

## 2016-12-10 ENCOUNTER — Other Ambulatory Visit: Payer: Self-pay | Admitting: Family Medicine

## 2016-12-10 NOTE — Telephone Encounter (Signed)
Printed and placed in to be faxed box.  

## 2016-12-24 ENCOUNTER — Encounter: Payer: Medicare Other | Admitting: Family Medicine

## 2017-03-11 ENCOUNTER — Ambulatory Visit (INDEPENDENT_AMBULATORY_CARE_PROVIDER_SITE_OTHER): Payer: Medicare Other | Admitting: Family Medicine

## 2017-03-11 ENCOUNTER — Encounter: Payer: Self-pay | Admitting: Family Medicine

## 2017-03-11 VITALS — BP 126/62 | HR 63 | Temp 97.9°F | Ht 67.0 in | Wt 264.4 lb

## 2017-03-11 DIAGNOSIS — B351 Tinea unguium: Secondary | ICD-10-CM | POA: Diagnosis not present

## 2017-03-11 DIAGNOSIS — I10 Essential (primary) hypertension: Secondary | ICD-10-CM

## 2017-03-11 DIAGNOSIS — E785 Hyperlipidemia, unspecified: Secondary | ICD-10-CM | POA: Diagnosis not present

## 2017-03-11 MED ORDER — TRAMADOL HCL 50 MG PO TABS: ORAL_TABLET | ORAL | 2 refills | Status: DC

## 2017-03-11 MED ORDER — LISINOPRIL-HYDROCHLOROTHIAZIDE 20-25 MG PO TABS: 1.0000 | ORAL_TABLET | Freq: Every day | ORAL | 2 refills | Status: DC

## 2017-03-11 MED ORDER — TERBINAFINE HCL 250 MG PO TABS: 250.0000 mg | ORAL_TABLET | Freq: Every day | ORAL | 3 refills | Status: DC

## 2017-03-11 MED ORDER — DICLOFENAC SODIUM 75 MG PO TBEC: 75.0000 mg | DELAYED_RELEASE_TABLET | Freq: Two times a day (BID) | ORAL | 6 refills | Status: DC | PRN

## 2017-03-11 MED ORDER — METOPROLOL SUCCINATE ER 25 MG PO TB24: 25.0000 mg | ORAL_TABLET | Freq: Every day | ORAL | 2 refills | Status: DC

## 2017-03-11 MED ORDER — CITALOPRAM HYDROBROMIDE 40 MG PO TABS: 60.0000 mg | ORAL_TABLET | Freq: Every day | ORAL | 1 refills | Status: DC
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
## 2017-03-11 NOTE — Patient Instructions (Signed)
It was good to see you again today.  I have sent in the Terbinafine for you for your toenails.   We are checking labs today as well.    Schedule a physical sometime in the next 3 months or so.

## 2017-03-12 ENCOUNTER — Encounter: Payer: Self-pay | Admitting: Family Medicine

## 2017-03-12 DIAGNOSIS — B351 Tinea unguium: Secondary | ICD-10-CM | POA: Insufficient documentation

## 2017-03-12 DIAGNOSIS — E785 Hyperlipidemia, unspecified: Secondary | ICD-10-CM | POA: Insufficient documentation

## 2017-03-12 LAB — CBC
HEMATOCRIT: 41.8 % (ref 34.0–46.6)
Hemoglobin: 13.4 g/dL (ref 11.1–15.9)
MCH: 28.3 pg (ref 26.6–33.0)
MCHC: 32.1 g/dL (ref 31.5–35.7)
MCV: 88 fL (ref 79–97)
PLATELETS: 276 10*3/uL (ref 150–379)
RBC: 4.73 x10E6/uL (ref 3.77–5.28)
RDW: 14.8 % (ref 12.3–15.4)
WBC: 4.9 10*3/uL (ref 3.4–10.8)

## 2017-03-12 LAB — COMPREHENSIVE METABOLIC PANEL
ALK PHOS: 71 IU/L (ref 39–117)
ALT: 15 IU/L (ref 0–32)
AST: 14 IU/L (ref 0–40)
Albumin/Globulin Ratio: 1.6 (ref 1.2–2.2)
Albumin: 4.2 g/dL (ref 3.6–4.8)
BUN/Creatinine Ratio: 27 (ref 12–28)
BUN: 22 mg/dL (ref 8–27)
Bilirubin Total: 0.2 mg/dL (ref 0.0–1.2)
CO2: 27 mmol/L (ref 18–29)
Calcium: 10.5 mg/dL — ABNORMAL HIGH (ref 8.7–10.3)
Chloride: 98 mmol/L (ref 96–106)
Creatinine, Ser: 0.82 mg/dL (ref 0.57–1.00)
GFR calc Af Amer: 88 mL/min/{1.73_m2} (ref >59)
GFR, EST NON AFRICAN AMERICAN: 76 mL/min/{1.73_m2} (ref >59)
Globulin, Total: 2.7 g/dL (ref 1.5–4.5)
Glucose: 96 mg/dL (ref 65–99)
Potassium: 4.6 mmol/L (ref 3.5–5.2)
Sodium: 140 mmol/L (ref 134–144)
Total Protein: 6.9 g/dL (ref 6.0–8.5)

## 2017-03-12 LAB — LIPID PANEL
CHOLESTEROL TOTAL: 220 mg/dL — AB (ref 100–199)
Chol/HDL Ratio: 4 ratio (ref 0.0–4.4)
HDL: 55 mg/dL (ref >39)
LDL Calculated: 144 mg/dL — ABNORMAL HIGH (ref 0–99)
TRIGLYCERIDES: 105 mg/dL (ref 0–149)
VLDL CHOLESTEROL CAL: 21 mg/dL (ref 5–40)

## 2017-03-12 NOTE — Assessment & Plan Note (Signed)
Checking lipid again today.

## 2017-03-12 NOTE — Assessment & Plan Note (Signed)
Terbinafine to treat. Checking LFTs today.   Will recheck at physical in a few weeks.

## 2017-03-12 NOTE — Assessment & Plan Note (Addendum)
At goal.   Continue current med regimen.  She would schedule for a physical in the next 2-3 months.  Can do pap then.

## 2017-04-15 ENCOUNTER — Ambulatory Visit (INDEPENDENT_AMBULATORY_CARE_PROVIDER_SITE_OTHER): Payer: Medicare Other | Admitting: Internal Medicine

## 2017-04-15 ENCOUNTER — Encounter: Payer: Self-pay | Admitting: Internal Medicine

## 2017-04-15 VITALS — BP 110/70 | HR 71 | Temp 98.2°F | Wt 264.0 lb

## 2017-04-15 DIAGNOSIS — J029 Acute pharyngitis, unspecified: Secondary | ICD-10-CM | POA: Insufficient documentation

## 2017-04-15 LAB — POCT RAPID STREP A (OFFICE): RAPID STREP A SCREEN: NEGATIVE

## 2017-04-15 NOTE — Patient Instructions (Signed)
I believe you have viral upper respiratory illness. Please take plenty of fluids. If you dont improve in the next 3-4 days please return for re-evaluation

## 2017-04-15 NOTE — Assessment & Plan Note (Signed)
URI symptoms, negative strep  Continue management with cold meds  Plenty of fluids  Follow up in 3-4 days if no improvement

## 2017-04-15 NOTE — Progress Notes (Signed)
   [facility] Noralee CharsAsiyah Ekansh Sherk, MD Phone: [phone]  Reason For Visit:   #URI  Has been sick for 3 days. Has felt warm, did not take temperature. Dry hacking cough. A little phlegm at times.   Nasal discharge: None  Medications tried: Cold medicine - tylenol and gunafecine  Sick contacts: 64 year old grandson   Symptoms Fever: yes, did not check temperature  Sneezing: None  Scratchy throat: None  Allergies: None Muscle aches: None  Fatigue: some fatigue  Shortness of breath: indicates feeling SOB  Rash: None  Sore throat or swollen glands: yes, swollen glands   Past Medical History Reviewed problem list.  Medications- reviewed and updated No additions to family history Social history- patient is a non-smoker  Objective: BP 110/70   Pulse 71   Temp 98.2 F (36.8 C) (Oral)   Wt 264 lb (119.7 kg)   SpO2 96%   BMI 41.35 kg/m  Gen: NAD, alert, cooperative with exam HEENT: Normal    Neck: No masses palpated. Positive for lymphadenopathy    Ears: Tympanic membranes intact, normal light reflex, no erythema, no bulging    Nose: nasal turbinates moist congested     Throat: post-nasal drip present  Cardio: regular rate and rhythm, S1S2 heard, no murmurs appreciated Pulm: clear to auscultation bilaterally, no wheezes, rhonchi or rales Skin: dry, intact, no rashes or lesions    Assessment/Plan: See problem based a/p  Sore throat URI symptoms, negative strep  Continue management with cold meds  Plenty of fluids  Follow up in 3-4 days if no improvement

## 2017-04-29 ENCOUNTER — Ambulatory Visit (INDEPENDENT_AMBULATORY_CARE_PROVIDER_SITE_OTHER): Payer: Medicare Other | Admitting: Family Medicine

## 2017-04-29 ENCOUNTER — Encounter: Payer: Self-pay | Admitting: Family Medicine

## 2017-04-29 VITALS — BP 108/58 | HR 76 | Temp 98.1°F | Ht 67.0 in | Wt 266.2 lb

## 2017-04-29 DIAGNOSIS — M25561 Pain in right knee: Secondary | ICD-10-CM | POA: Diagnosis not present

## 2017-04-29 DIAGNOSIS — I10 Essential (primary) hypertension: Secondary | ICD-10-CM | POA: Diagnosis not present

## 2017-04-29 DIAGNOSIS — G8929 Other chronic pain: Secondary | ICD-10-CM | POA: Diagnosis not present

## 2017-04-29 DIAGNOSIS — M25572 Pain in left ankle and joints of left foot: Secondary | ICD-10-CM

## 2017-04-29 MED ORDER — LISINOPRIL-HYDROCHLOROTHIAZIDE 20-25 MG PO TABS: 1.0000 | ORAL_TABLET | Freq: Every day | ORAL | 2 refills | Status: DC

## 2017-04-29 NOTE — Assessment & Plan Note (Addendum)
-   Actually low today.  Low back early this month as well.  - Still taking her BP meds.   - Adamantly denies any lightheaded symptoms.  If any lightheadedness to hold her ACE combo pill.  Continue beta blocker. She already has follow-up to see me as scheduled next month. We will recheck her blood pressure that visit.
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
## 2017-04-29 NOTE — Patient Instructions (Signed)
It was good to see you again today.  Make sure to do the ankle raising exercises like we discussed.  If you start having worsening persistent pain in your knee let me know. We will need to do an x-ray and steroid shot at that point.  Make sure to pick up the foot inserts something like Dr. Margart SicklesScholl's which will provide you more arch support.

## 2017-04-29 NOTE — Assessment & Plan Note (Signed)
Chronic. No actual injury. She is actually better now that she is able [place] even though she cannot do it at a Pool. If this  returns we will likely need to get weightbearing x-rays. She is also open to a corticosteroid shot.

## 2017-04-29 NOTE — Assessment & Plan Note (Signed)
Secondary to pes planus. She has an ankle brace at home. Recommended her to wear this. See AVS for full details. In short she is to do more weightbearing exercise to increase the strength in that ankle. We're also doing inserts for her shoes. She'll follow-up in a month and will see her she's doing at that point.

## 2017-07-10 ENCOUNTER — Other Ambulatory Visit: Payer: Self-pay | Admitting: Family Medicine

## 2017-08-11 ENCOUNTER — Other Ambulatory Visit: Payer: Self-pay | Admitting: Family Medicine

## 2017-09-12 DIAGNOSIS — Z79899 Other long term (current) drug therapy: Secondary | ICD-10-CM | POA: Diagnosis not present

## 2017-09-12 DIAGNOSIS — Z905 Acquired absence of kidney: Secondary | ICD-10-CM | POA: Diagnosis not present

## 2017-09-12 DIAGNOSIS — Z91048 Other nonmedicinal substance allergy status: Secondary | ICD-10-CM | POA: Diagnosis not present

## 2017-09-12 DIAGNOSIS — S8001XA Contusion of right knee, initial encounter: Secondary | ICD-10-CM | POA: Diagnosis not present

## 2017-09-12 DIAGNOSIS — Z91012 Allergy to eggs: Secondary | ICD-10-CM | POA: Diagnosis not present

## 2017-09-12 DIAGNOSIS — G8911 Acute pain due to trauma: Secondary | ICD-10-CM | POA: Diagnosis not present

## 2017-09-16 ENCOUNTER — Other Ambulatory Visit: Payer: Self-pay | Admitting: Family Medicine

## 2017-09-16 ENCOUNTER — Other Ambulatory Visit (HOSPITAL_COMMUNITY): Payer: Self-pay | Admitting: Psychiatry

## 2017-09-19 ENCOUNTER — Ambulatory Visit (INDEPENDENT_AMBULATORY_CARE_PROVIDER_SITE_OTHER): Payer: Medicare Other | Admitting: Family Medicine

## 2017-09-19 ENCOUNTER — Encounter: Payer: Self-pay | Admitting: Family Medicine

## 2017-09-19 VITALS — BP 124/70 | HR 73 | Temp 98.0°F | Ht 67.0 in | Wt 279.8 lb

## 2017-09-19 DIAGNOSIS — Z8041 Family history of malignant neoplasm of ovary: Secondary | ICD-10-CM

## 2017-09-19 DIAGNOSIS — M25561 Pain in right knee: Secondary | ICD-10-CM

## 2017-09-19 DIAGNOSIS — N83209 Unspecified ovarian cyst, unspecified side: Secondary | ICD-10-CM

## 2017-09-19 DIAGNOSIS — R1900 Intra-abdominal and pelvic swelling, mass and lump, unspecified site: Secondary | ICD-10-CM
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
## 2017-09-19 NOTE — Patient Instructions (Signed)
It was good to see you again today.  We are checking blood levels like we discussed.  We are getting your MRI scheduled for next week.  I will call you with these results.  Have a great weekend

## 2017-09-22 ENCOUNTER — Telehealth: Payer: Self-pay | Admitting: *Deleted

## 2017-09-22 NOTE — Telephone Encounter (Signed)
Message left on clinic nurse voice mail - returned call to patient.    -States she has appt tomorrow for right knee MRI and labs at [facility].   -Also, has talked with Melvenia BeamShari about handicap placard.   -Wants to let Dr. Gwendolyn GrantWalden know that her family wants to place her in a nursing home.   -Patient will speak with Melvenia BeamShari when she comes in office for labs tomorrow. -She will also need Rx for knee brace because [facility] doesn't carry one in her size and she will need to get the brace from a medical supply store.    Routed call to PCP and Clemencia CourseShari Saunders, CMA.     Paige Jenkins~Paige Jenkins, BSN, RN-BC

## 2017-09-23 ENCOUNTER — Ambulatory Visit (HOSPITAL_COMMUNITY): Admission: RE | Admit: 2017-09-23 | Payer: Medicare Other | Source: Ambulatory Visit

## 2017-09-23 ENCOUNTER — Other Ambulatory Visit: Payer: Medicare Other

## 2017-09-24 DIAGNOSIS — Z638 Other specified problems related to primary support group: Secondary | ICD-10-CM | POA: Diagnosis not present

## 2017-09-24 DIAGNOSIS — Z0489 Encounter for examination and observation for other specified reasons: Secondary | ICD-10-CM | POA: Diagnosis not present

## 2017-09-24 DIAGNOSIS — Z91048 Other nonmedicinal substance allergy status: Secondary | ICD-10-CM | POA: Diagnosis not present

## 2017-09-24 DIAGNOSIS — Z91012 Allergy to eggs: Secondary | ICD-10-CM | POA: Diagnosis not present

## 2017-09-24 DIAGNOSIS — Z79899 Other long term (current) drug therapy: Secondary | ICD-10-CM | POA: Diagnosis not present

## 2017-09-24 DIAGNOSIS — Z905 Acquired absence of kidney: Secondary | ICD-10-CM | POA: Diagnosis not present

## 2017-09-25 ENCOUNTER — Ambulatory Visit (HOSPITAL_COMMUNITY)
Admission: RE | Admit: 2017-09-25 | Discharge: 2017-09-25 | Disposition: A | Discharge status: Home / Self Care | Payer: Medicare Other | Source: Ambulatory Visit | Attending: Family Medicine | Admitting: Family Medicine

## 2017-09-25 ENCOUNTER — Other Ambulatory Visit: Payer: Medicare Other

## 2017-09-25 DIAGNOSIS — M25561 Pain in right knee: Secondary | ICD-10-CM | POA: Diagnosis not present

## 2017-09-25 DIAGNOSIS — R937 Abnormal findings on diagnostic imaging of other parts of musculoskeletal system: Secondary | ICD-10-CM | POA: Diagnosis not present

## 2017-09-25 DIAGNOSIS — R1900 Intra-abdominal and pelvic swelling, mass and lump, unspecified site: Secondary | ICD-10-CM | POA: Diagnosis not present

## 2017-09-25 DIAGNOSIS — M25461 Effusion, right knee: Secondary | ICD-10-CM | POA: Diagnosis not present

## 2017-09-25 NOTE — Telephone Encounter (Signed)
Ok thanks for the update.  I will await the MRI results and call her with these.  Thanks

## 2017-09-26 LAB — BASIC METABOLIC PANEL
BUN/Creatinine Ratio: 18 (ref 12–28)
BUN: 16 mg/dL (ref 8–27)
CALCIUM: 9.5 mg/dL (ref 8.7–10.3)
CHLORIDE: 99 mmol/L (ref 96–106)
CO2: 26 mmol/L (ref 20–29)
Creatinine, Ser: 0.87 mg/dL (ref 0.57–1.00)
GFR, EST AFRICAN AMERICAN: 81 mL/min/{1.73_m2} (ref >59)
GFR, EST NON AFRICAN AMERICAN: 71 mL/min/{1.73_m2} (ref >59)
Glucose: 93 mg/dL (ref 65–99)
POTASSIUM: 4.9 mmol/L (ref 3.5–5.2)
Sodium: 141 mmol/L (ref 134–144)

## 2017-09-26 LAB — CBC
HEMOGLOBIN: 13.3 g/dL (ref 11.1–15.9)
Hematocrit: 41.2 % (ref 34.0–46.6)
MCH: 28.9 pg (ref 26.6–33.0)
MCHC: 32.3 g/dL (ref 31.5–35.7)
MCV: 90 fL (ref 79–97)
PLATELETS: 287 10*3/uL (ref 150–379)
RBC: 4.6 x10E6/uL (ref 3.77–5.28)
RDW: 14.5 % (ref 12.3–15.4)
WBC: 6.7 10*3/uL (ref 3.4–10.8)

## 2017-09-26 LAB — CA 125: CANCER ANTIGEN (CA) 125: 10.9 U/mL (ref 0.0–38.1)

## 2017-09-29 ENCOUNTER — Telehealth: Payer: Self-pay | Admitting: *Deleted

## 2017-09-29 DIAGNOSIS — M23311 Other meniscus derangements, anterior horn of medial meniscus, right knee: Secondary | ICD-10-CM

## 2017-09-29 DIAGNOSIS — M1711 Unilateral primary osteoarthritis, right knee: Secondary | ICD-10-CM

## 2017-09-29 NOTE — Telephone Encounter (Signed)
Patient left message on nurse line requesting return call from PCP regarding MRI results, lab results and next steps. Kinnie FeilL. Ducatte, RN, BSN

## 2017-09-30 ENCOUNTER — Encounter: Payer: Self-pay | Admitting: *Deleted

## 2017-09-30 NOTE — Telephone Encounter (Signed)
Called and discussed results with patient of normal labs and MRI findings -- essentially tricompartment syndrome, maceration of meniscus, and possible stress fracture/reaction.    She was unable to find a brace large enough for her knee.  Will place urgent referral to orthopedics for further evaluations as she is having difficulties ambulating and is in pain.   Patient appreciated call

## 2017-10-09 ENCOUNTER — Telehealth: Payer: Self-pay | Admitting: *Deleted

## 2017-10-09 NOTE — Telephone Encounter (Signed)
Pt lm on nurse line, she is curious because she has not heard from an ortho yet.    Called and lm for her to call back.  Referral was [facility] workqueue on 10/06/17, please let her know to call them at [phone] to followup on the status Paige Jenkins, Maryjo RochesterJessica Dawn, CMA

## 2017-10-13 ENCOUNTER — Other Ambulatory Visit: Payer: Self-pay | Admitting: Family Medicine

## 2017-10-21 DIAGNOSIS — M25561 Pain in right knee: Secondary | ICD-10-CM | POA: Diagnosis not present

## 2017-10-22 ENCOUNTER — Telehealth: Payer: Self-pay | Admitting: Family Medicine

## 2017-10-22 NOTE — Telephone Encounter (Signed)
Pt would like to talk to dr Gwendolyn Grantwalden.  Dr Candy SledgeKaffree is suggesting a total right knee replacement.  She needs medical clearance from dr Gwendolyn Grantwalden.  Surgery is scheduled for 11-21-17

## 2017-10-24 NOTE — Telephone Encounter (Signed)
CAlled patient back.  She was calling mostly as FYI to see that surgical clearance had been requested.  I will complete this and send to her surgeon.

## 2017-11-06 ENCOUNTER — Ambulatory Visit: Payer: Self-pay | Admitting: Physician Assistant
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
## 2017-11-07 NOTE — Pre-Procedure Instructions (Signed)
Paige CreeLouann L Stroh  11/07/2017      [facility] [place] - [address] [place] [address] Phone: [phone] Fax: [phone]    Your procedure is scheduled on Friday, November 21, 2017  Report [facility] "A" at 5:30AM  Call this number if you have problems the morning of surgery:  [phone]   Remember:  Do not eat food or drink liquids after midnight.  Take these medicines the morning of surgery with A SIP OF WATER: Citalopram (CELEXA), and Metoprolol succinate (TOPROL-XL). If needed TraMADol Janean Sark(ULTRAM) for pain.  7 days before surgery (Jan. 4), stop taking all Aspirins, Vitamins, Fish oils, and Herbal medications. Also stop all NSAIDS i.e. Advil, Ibuprofen, Motrin, Aleve, Anaprox, Naproxen, BC and Goody Powders.   Do not wear jewelry, make-up or nail polish.  Do not wear lotions, powders, perfumes, or deodorant.  Do not shave 48 hours prior to surgery.    Do not bring valuables to the hospital.  [facility] is not responsible for any belongings or valuables.  Contacts, dentures or bridgework may not be worn into surgery.  Leave your suitcase in the car.  After surgery it may be brought to your room.  For patients admitted to the hospital, discharge time will be determined by your treatment team.  Patients discharged the day of surgery will not be allowed to drive home.   Special instructions:   [place]- Preparing For Surgery  Before surgery, you can play an important role. Because skin is not sterile, your skin needs to be as free of germs as possible. You can reduce the number of germs on your skin by washing with CHG (chlorahexidine gluconate) Soap before surgery.  CHG is an antiseptic cleaner which kills germs and bonds with the skin to continue killing germs even after washing.  Please do not use if you have an allergy to CHG or antibacterial soaps. If your skin becomes reddened/irritated stop using  the CHG.  Do not shave (including legs and underarms) for at least 48 hours prior to first CHG shower. It is OK to shave your face.  Please follow these instructions carefully.   1. Shower the NIGHT BEFORE SURGERY and the MORNING OF SURGERY with CHG.   2. If you chose to wash your hair, wash your hair first as usual with your normal shampoo.  3. After you shampoo, rinse your hair and body thoroughly to remove the shampoo.  4. Use CHG as you would any other liquid soap. You can apply CHG directly to the skin and wash gently with a scrungie or a clean washcloth.   5. Apply the CHG Soap to your body ONLY FROM THE NECK DOWN.  Do not use on open wounds or open sores. Avoid contact with your eyes, ears, mouth and genitals (private parts). Wash Face and genitals (private parts)  with your normal soap.  6. Wash thoroughly, paying special attention to the area where your surgery will be performed.  7. Thoroughly rinse your body with warm water from the neck down.  8. DO NOT shower/wash with your normal soap after using and rinsing off the CHG Soap.  9. Pat yourself dry with a CLEAN TOWEL.  10. Wear CLEAN PAJAMAS to bed the night before surgery, wear comfortable clothes the morning of surgery  11. Place CLEAN SHEETS on your bed the night of your first shower and DO NOT SLEEP WITH PETS.  Day of Surgery: Do not apply any deodorants/lotions. Please wear clean clothes to the hospital/surgery center.    Please read over the following fact sheets that you were given. Pain Booklet, Coughing and Deep Breathing, Blood Transfusion Information, Total Joint Packet, MRSA Information and Surgical Site Infection Prevention

## 2017-11-08 ENCOUNTER — Other Ambulatory Visit: Payer: Self-pay | Admitting: Family Medicine

## 2017-11-10 ENCOUNTER — Ambulatory Visit (HOSPITAL_COMMUNITY)
Admission: RE | Admit: 2017-11-10 | Discharge: 2017-11-10 | Disposition: A | Discharge status: Home / Self Care | Payer: Medicare Other | Source: Ambulatory Visit | Attending: Physician Assistant | Admitting: Physician Assistant

## 2017-11-10 ENCOUNTER — Other Ambulatory Visit: Payer: Self-pay

## 2017-11-10 ENCOUNTER — Encounter (HOSPITAL_COMMUNITY): Payer: Self-pay

## 2017-11-10 ENCOUNTER — Encounter (HOSPITAL_COMMUNITY)
Admission: RE | Admit: 2017-11-10 | Discharge: 2017-11-10 | Disposition: A | Discharge status: Home / Self Care | Payer: Medicare Other | Source: Ambulatory Visit | Attending: Orthopedic Surgery | Admitting: Orthopedic Surgery

## 2017-11-10 DIAGNOSIS — E669 Obesity, unspecified: Secondary | ICD-10-CM | POA: Diagnosis not present

## 2017-11-10 DIAGNOSIS — I10 Essential (primary) hypertension: Secondary | ICD-10-CM | POA: Insufficient documentation

## 2017-11-10 DIAGNOSIS — Z01818 Encounter for other preprocedural examination: Secondary | ICD-10-CM | POA: Insufficient documentation

## 2017-11-10 DIAGNOSIS — F329 Major depressive disorder, single episode, unspecified: Secondary | ICD-10-CM | POA: Insufficient documentation

## 2017-11-10 DIAGNOSIS — Z905 Acquired absence of kidney: Secondary | ICD-10-CM | POA: Diagnosis not present

## 2017-11-10 DIAGNOSIS — Z01812 Encounter for preprocedural laboratory examination: Secondary | ICD-10-CM | POA: Diagnosis not present

## 2017-11-10 DIAGNOSIS — M1711 Unilateral primary osteoarthritis, right knee: Secondary | ICD-10-CM | POA: Insufficient documentation

## 2017-11-10 DIAGNOSIS — Z01810 Encounter for preprocedural cardiovascular examination: Secondary | ICD-10-CM | POA: Insufficient documentation

## 2017-11-10 DIAGNOSIS — I471 Supraventricular tachycardia: Secondary | ICD-10-CM | POA: Insufficient documentation

## 2017-11-10 HISTORY — DX: Unilateral primary osteoarthritis, unspecified knee: M17.10

## 2017-11-10 LAB — CBC WITH DIFFERENTIAL/PLATELET
BASOS ABS: 0 10*3/uL (ref 0.0–0.1)
Basophils Relative: 0 %
Eosinophils Absolute: 0.1 10*3/uL (ref 0.0–0.7)
Eosinophils Relative: 2 %
HEMATOCRIT: 44 % (ref 36.0–46.0)
Hemoglobin: 14.2 g/dL (ref 12.0–15.0)
LYMPHS PCT: 35 %
Lymphs Abs: 2.4 10*3/uL (ref 0.7–4.0)
MCH: 29.5 pg (ref 26.0–34.0)
MCHC: 32.3 g/dL (ref 30.0–36.0)
MCV: 91.5 fL (ref 78.0–100.0)
MONO ABS: 0.5 10*3/uL (ref 0.1–1.0)
Monocytes Relative: 7 %
NEUTROS ABS: 3.9 10*3/uL (ref 1.7–7.7)
Neutrophils Relative %: 56 %
Platelets: 253 10*3/uL (ref 150–400)
RBC: 4.81 MIL/uL (ref 3.87–5.11)
RDW: 14.8 % (ref 11.5–15.5)
WBC: 7 10*3/uL (ref 4.0–10.5)

## 2017-11-10 LAB — TYPE AND SCREEN
ABO/RH(D): AB POS
Antibody Screen: NEGATIVE

## 2017-11-10 LAB — APTT: APTT: 31 s (ref 24–36)

## 2017-11-10 LAB — PROTIME-INR
INR: 0.96
PROTHROMBIN TIME: 12.7 s (ref 11.4–15.2)

## 2017-11-10 LAB — SURGICAL PCR SCREEN
MRSA, PCR: POSITIVE — AB
STAPHYLOCOCCUS AUREUS: POSITIVE — AB

## 2017-11-10 LAB — COMPREHENSIVE METABOLIC PANEL
ALT: 17 U/L (ref 14–54)
AST: 19 U/L (ref 15–41)
Albumin: 3.7 g/dL (ref 3.5–5.0)
Alkaline Phosphatase: 65 U/L (ref 38–126)
Anion gap: 9 (ref 5–15)
BILIRUBIN TOTAL: 0.5 mg/dL (ref 0.3–1.2)
BUN: 15 mg/dL (ref 6–20)
CO2: 22 mmol/L (ref 22–32)
CREATININE: 0.77 mg/dL (ref 0.44–1.00)
Calcium: 9.3 mg/dL (ref 8.9–10.3)
Chloride: 103 mmol/L (ref 101–111)
GFR calc Af Amer: >60 mL/min (ref >60)
Glucose, Bld: 99 mg/dL (ref 65–99)
Potassium: 4 mmol/L (ref 3.5–5.1)
Sodium: 134 mmol/L — ABNORMAL LOW (ref 135–145)
TOTAL PROTEIN: 6.7 g/dL (ref 6.5–8.1)

## 2017-11-10 LAB — ABO/RH: ABO/RH(D): AB POS
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
## 2017-11-10 NOTE — Progress Notes (Signed)
Pt made aware of +PCR screen and prescription to be filled.

## 2017-11-10 NOTE — Progress Notes (Signed)
[facility] Mup[facility]. Unable to reach pt or leave a message. Will try to contact again.

## 2017-11-12 ENCOUNTER — Encounter (HOSPITAL_COMMUNITY): Payer: Self-pay | Admitting: Vascular Surgery
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
## 2017-11-14 LAB — URINE CULTURE: Culture: >=100000 — AB

## 2017-11-21 ENCOUNTER — Encounter (HOSPITAL_COMMUNITY): Admission: RE | Payer: Self-pay | Source: Ambulatory Visit

## 2017-11-21 ENCOUNTER — Ambulatory Visit (HOSPITAL_COMMUNITY): Admission: RE | Admit: 2017-11-21 | Payer: Medicare Other | Source: Ambulatory Visit | Admitting: Orthopedic Surgery

## 2017-11-21 SURGERY — ARTHROPLASTY, KNEE, TOTAL
Anesthesia: Choice | Laterality: Right

## 2017-12-17 ENCOUNTER — Ambulatory Visit: Payer: Self-pay | Admitting: Physician Assistant
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
## 2017-12-19 ENCOUNTER — Ambulatory Visit: Payer: Medicare Other

## 2017-12-29 ENCOUNTER — Encounter (HOSPITAL_COMMUNITY)
Admission: RE | Admit: 2017-12-29 | Discharge: 2017-12-29 | Disposition: A | Discharge status: Home / Self Care | Payer: Medicare Other | Source: Ambulatory Visit | Attending: Orthopedic Surgery | Admitting: Orthopedic Surgery

## 2017-12-29 ENCOUNTER — Other Ambulatory Visit: Payer: Self-pay

## 2017-12-29 ENCOUNTER — Encounter (HOSPITAL_COMMUNITY): Payer: Self-pay

## 2017-12-29 ENCOUNTER — Other Ambulatory Visit (HOSPITAL_COMMUNITY): Payer: Self-pay | Admitting: *Deleted

## 2017-12-29 DIAGNOSIS — Z01812 Encounter for preprocedural laboratory examination: Secondary | ICD-10-CM | POA: Diagnosis not present

## 2017-12-29 DIAGNOSIS — H5213 Myopia, bilateral: Secondary | ICD-10-CM | POA: Diagnosis not present

## 2017-12-29 DIAGNOSIS — H524 Presbyopia: Secondary | ICD-10-CM | POA: Diagnosis not present

## 2017-12-29 LAB — URINALYSIS, ROUTINE W REFLEX MICROSCOPIC
Bilirubin Urine: NEGATIVE
Glucose, UA: NEGATIVE mg/dL
HGB URINE DIPSTICK: NEGATIVE
Ketones, ur: NEGATIVE mg/dL
Leukocytes, UA: NEGATIVE
Nitrite: NEGATIVE
PROTEIN: NEGATIVE mg/dL
SPECIFIC GRAVITY, URINE: 1.017 (ref 1.005–1.030)
pH: 5 (ref 5.0–8.0)

## 2017-12-29 LAB — CBC WITH DIFFERENTIAL/PLATELET
BASOS ABS: 0 10*3/uL (ref 0.0–0.1)
BASOS PCT: 1 %
Eosinophils Absolute: 0.3 10*3/uL (ref 0.0–0.7)
Eosinophils Relative: 5 %
HEMATOCRIT: 44.7 % (ref 36.0–46.0)
Hemoglobin: 14.7 g/dL (ref 12.0–15.0)
LYMPHS PCT: 40 %
Lymphs Abs: 2 10*3/uL (ref 0.7–4.0)
MCH: 30.2 pg (ref 26.0–34.0)
MCHC: 32.9 g/dL (ref 30.0–36.0)
MCV: 91.8 fL (ref 78.0–100.0)
Monocytes Absolute: 0.5 10*3/uL (ref 0.1–1.0)
Monocytes Relative: 10 %
Neutro Abs: 2.3 10*3/uL (ref 1.7–7.7)
Neutrophils Relative %: 44 %
PLATELETS: 258 10*3/uL (ref 150–400)
RBC: 4.87 MIL/uL (ref 3.87–5.11)
RDW: 14.8 % (ref 11.5–15.5)
WBC: 5.1 10*3/uL (ref 4.0–10.5)

## 2017-12-29 LAB — COMPREHENSIVE METABOLIC PANEL
ALT: 21 U/L (ref 14–54)
AST: 22 U/L (ref 15–41)
Albumin: 3.8 g/dL (ref 3.5–5.0)
Alkaline Phosphatase: 73 U/L (ref 38–126)
Anion gap: 12 (ref 5–15)
BILIRUBIN TOTAL: 0.3 mg/dL (ref 0.3–1.2)
BUN: 12 mg/dL (ref 6–20)
CHLORIDE: 103 mmol/L (ref 101–111)
CO2: 23 mmol/L (ref 22–32)
CREATININE: 0.83 mg/dL (ref 0.44–1.00)
Calcium: 9.1 mg/dL (ref 8.9–10.3)
GFR calc Af Amer: >60 mL/min (ref >60)
GLUCOSE: 96 mg/dL (ref 65–99)
Potassium: 4.3 mmol/L (ref 3.5–5.1)
Sodium: 138 mmol/L (ref 135–145)
Total Protein: 7 g/dL (ref 6.5–8.1)

## 2017-12-29 LAB — TYPE AND SCREEN
ABO/RH(D): AB POS
Antibody Screen: NEGATIVE

## 2017-12-29 LAB — SURGICAL PCR SCREEN
MRSA, PCR: NEGATIVE
STAPHYLOCOCCUS AUREUS: NEGATIVE

## 2017-12-29 NOTE — Progress Notes (Signed)
Pt denies cardiac history, chest pain or sob. Pt states she is not diabetic. Pt had PAT on 11/10/17 and surgery was cancelled to pt having the flu. See Leonarda SalonAllison Zellenak's note from 11/10/17.

## 2017-12-29 NOTE — Pre-Procedure Instructions (Signed)
Paige Jenkins  12/29/2017    Your procedure is scheduled on Friday, January 09, 2018 at 7:30 AM.   [facility] at 5:30 AM.   Call this number if you have problems the morning of surgery: [phone]   Questions prior to day of surgery, please call [phone] between 8 & 4 PM.   Remember:  Do not eat food or drink liquids after midnight Thursday, 01/08/18.  Take these medicines the morning of surgery with A SIP OF WATER: Citalopram (Celexa), Metoprolol (Toprol XL), Tramadol - if needed.  Stop NSAIDS (Voltaren, Aleve, Ibuprofen, etc) and Herbal medications 7 days prior to surgery. Do not use Aspirin products 7 days prior to surgery.    Do not wear jewelry, make-up or nail polish.  Do not wear lotions, powders, perfumes or deodorant.  Do not shave 48 hours prior to surgery.    Do not bring valuables to the hospital.  [facility] is not responsible for any belongings or valuables.  Contacts, dentures or bridgework may not be worn into surgery.  Leave your suitcase in the car.  After surgery it may be brought to your room.  For patients admitted to the hospital, discharge time will be determined by your treatment team.  [facility] - Pre[facility]  Before surgery, you can play an important role.  Because skin is not sterile, your skin needs to be as free of germs as possible.  You can reduce the number of germs on you skin by washing with CHG (chlorahexidine gluconate) soap before surgery.  CHG is an antiseptic cleaner which kills germs and bonds with the skin to continue killing germs even after washing.  Please DO NOT use if you have an allergy to CHG or antibacterial soaps.  If your skin becomes reddened/irritated stop using the CHG and inform your nurse when you arrive at Short Stay.  Do not shave (including legs and underarms) for at least 48 hours prior to the first CHG shower.  You may shave your face.  Please follow  these instructions carefully:   1.  Shower with CHG Soap the night before surgery and the                    morning of Surgery.  2.  If you choose to wash your hair, wash your hair first as usual with your       normal shampoo.  3.  After you shampoo, rinse your hair and body thoroughly to remove the shampoo.  4.  Use CHG as you would any other liquid soap.  You can apply chg directly       to the skin and wash gently with scrungie or a clean washcloth.  5.  Apply the CHG Soap to your body ONLY FROM THE NECK DOWN.        Do not use on open wounds or open sores.  Avoid contact with your eyes, ears, mouth and genitals (private parts).  Wash genitals (private parts) with your normal soap.  6.  Wash thoroughly, paying special attention to the area where your surgery        will be performed.  7.  Thoroughly rinse your body with warm water from the neck down.  8.  DO NOT shower/wash with your normal soap after using and rinsing off       the CHG Soap.  9.  Pat yourself dry with a clean towel.  10.  Wear clean pajamas.            11.  Place clean sheets on your bed the night of your first shower and do not        sleep with pets.  Day of Surgery  Shower as above. Do not apply any lotions/deodorants the morning of surgery.  Please wear clean clothes to the hospital.   Please read over the fact sheets that you were given.

## 2017-12-30 LAB — URINE CULTURE: CULTURE: NO GROWTH

## 2018-01-08 MED ORDER — VANCOMYCIN HCL 10 G IV SOLR
1500.0000 mg | INTRAVENOUS | Status: AC
  Administered 2018-01-09: 1500 mg via INTRAVENOUS
  Filled 2018-01-08: qty 1500

## 2018-01-08 MED ORDER — TRANEXAMIC ACID 1000 MG/10ML IV SOLN
1000.0000 mg | INTRAVENOUS | Status: AC
  Administered 2018-01-09: 1000 mg via INTRAVENOUS
  Filled 2018-01-08: qty 1100

## 2018-01-08 MED ORDER — TRANEXAMIC ACID 1000 MG/10ML IV SOLN
2000.0000 mg | INTRAVENOUS | Status: AC
  Administered 2018-01-09: 2000 mg via TOPICAL
  Filled 2018-01-08: qty 20

## 2018-01-08 MED ORDER — BUPIVACAINE LIPOSOME 1.3 % IJ SUSP
20.0000 mL | INTRAMUSCULAR | Status: AC
  Administered 2018-01-09: 20 mL
  Filled 2018-01-08: qty 20

## 2018-01-08 MED ORDER — DEXTROSE 5 % IV SOLN
3.0000 g | INTRAVENOUS | Status: AC
  Administered 2018-01-09: 3 g via INTRAVENOUS
  Filled 2018-01-08: qty 3

## 2018-01-08 NOTE — Anesthesia Preprocedure Evaluation (Addendum)
Anesthesia Evaluation  Patient identified by MRN, date of birth, ID band Patient awake    Reviewed: Allergy & Precautions, NPO status , Patient's Chart, lab work & pertinent test results, reviewed documented beta blocker date and time   Airway Mallampati: II  TM Distance: >3 FB Neck ROM: Full    Dental  (+) Missing   Pulmonary neg pulmonary ROS,    Pulmonary exam normal breath sounds clear to auscultation       Cardiovascular hypertension, Pt. on home beta blockers and Pt. on medications Normal cardiovascular exam Rhythm:Regular Rate:Normal  ECG: SR, rate 62   Neuro/Psych  Headaches, PSYCHIATRIC DISORDERS Depression    GI/Hepatic negative GI ROS, Neg liver ROS,   Endo/Other  Morbid obesity  Renal/GU negative Renal ROS     Musculoskeletal  (+) Arthritis ,   Abdominal   Peds  Hematology negative hematology ROS (+)   Anesthesia Other Findings   Reproductive/Obstetrics                            Anesthesia Physical Anesthesia Plan  ASA: III  Anesthesia Plan: General   Post-op Pain Management:    Induction: Intravenous  PONV Risk Score and Plan: 3 and Ondansetron, Midazolam, Dexamethasone and Treatment may vary due to age or medical condition  Airway Management Planned: Oral ETT  Additional Equipment:   Intra-op Plan:   Post-operative Plan: Extubation in OR  Informed Consent: I have reviewed the patients History and Physical, chart, labs and discussed the procedure including the risks, benefits and alternatives for the proposed anesthesia with the patient or authorized representative who has indicated his/her understanding and acceptance.   Dental advisory given  Plan Discussed with: CRNA  Anesthesia Plan Comments: (Patient refuses spinal and regional anesthetic )        Anesthesia Quick Evaluation

## 2018-01-09 ENCOUNTER — Encounter (HOSPITAL_COMMUNITY): Payer: Self-pay | Admitting: Anesthesiology

## 2018-01-09 ENCOUNTER — Encounter (HOSPITAL_COMMUNITY)
Admission: RE | Disposition: A | Discharge status: Rehabilitation Facility | Payer: Self-pay | Source: Ambulatory Visit | Attending: Orthopedic Surgery

## 2018-01-09 ENCOUNTER — Ambulatory Visit (HOSPITAL_COMMUNITY): Payer: Medicare Other | Admitting: Anesthesiology

## 2018-01-09 ENCOUNTER — Inpatient Hospital Stay (HOSPITAL_COMMUNITY)
Admission: RE | Admit: 2018-01-09 | Discharge: 2018-01-13 | DRG: 470 | Disposition: A | Discharge status: Rehabilitation Facility | Payer: Medicare Other | Source: Ambulatory Visit | Attending: Orthopedic Surgery | Admitting: Orthopedic Surgery

## 2018-01-09 DIAGNOSIS — Z905 Acquired absence of kidney: Secondary | ICD-10-CM | POA: Diagnosis not present

## 2018-01-09 DIAGNOSIS — M1711 Unilateral primary osteoarthritis, right knee: Principal | ICD-10-CM | POA: Diagnosis present

## 2018-01-09 DIAGNOSIS — Z791 Long term (current) use of non-steroidal anti-inflammatories (NSAID): Secondary | ICD-10-CM | POA: Diagnosis not present

## 2018-01-09 DIAGNOSIS — Z6841 Body Mass Index (BMI) 40.0 and over, adult: Secondary | ICD-10-CM | POA: Diagnosis not present

## 2018-01-09 DIAGNOSIS — R402413 Glasgow coma scale score 13-15, at hospital admission: Secondary | ICD-10-CM | POA: Diagnosis present

## 2018-01-09 DIAGNOSIS — Z91041 Radiographic dye allergy status: Secondary | ICD-10-CM

## 2018-01-09 DIAGNOSIS — Z79899 Other long term (current) drug therapy: Secondary | ICD-10-CM | POA: Diagnosis not present

## 2018-01-09 DIAGNOSIS — Z91012 Allergy to eggs: Secondary | ICD-10-CM

## 2018-01-09 DIAGNOSIS — I10 Essential (primary) hypertension: Secondary | ICD-10-CM | POA: Diagnosis not present

## 2018-01-09 DIAGNOSIS — F329 Major depressive disorder, single episode, unspecified: Secondary | ICD-10-CM | POA: Diagnosis present

## 2018-01-09 DIAGNOSIS — R51 Headache: Secondary | ICD-10-CM | POA: Diagnosis present

## 2018-01-09 DIAGNOSIS — G8918 Other acute postprocedural pain: Secondary | ICD-10-CM | POA: Diagnosis not present

## 2018-01-09 DIAGNOSIS — E785 Hyperlipidemia, unspecified: Secondary | ICD-10-CM | POA: Diagnosis not present

## 2018-01-09 HISTORY — PX: TOTAL KNEE ARTHROPLASTY: SHX125

## 2018-01-09 SURGERY — ARTHROPLASTY, KNEE, TOTAL
Anesthesia: General | Site: Knee | Laterality: Right

## 2018-01-09 MED ORDER — DIPHENHYDRAMINE HCL 12.5 MG/5ML PO ELIX: 12.5000 mg | ORAL_SOLUTION | ORAL | Status: DC | PRN

## 2018-01-09 MED ORDER — ONDANSETRON HCL 4 MG/2ML IJ SOLN: 4.0000 mg | Freq: Four times a day (QID) | INTRAMUSCULAR | Status: DC | PRN

## 2018-01-09 MED ORDER — BUPIVACAINE-EPINEPHRINE (PF) 0.5% -1:200000 IJ SOLN
INTRAMUSCULAR | Status: AC
  Filled 2018-01-09: qty 60

## 2018-01-09 MED ORDER — CITALOPRAM HYDROBROMIDE 40 MG PO TABS
60.0000 mg | ORAL_TABLET | Freq: Every day | ORAL | Status: DC
  Administered 2018-01-10 – 2018-01-13 (×4): 60 mg via ORAL
  Filled 2018-01-09 (×4): qty 1

## 2018-01-09 MED ORDER — LIDOCAINE HCL (CARDIAC) 20 MG/ML IV SOLN
INTRAVENOUS | Status: DC | PRN
  Administered 2018-01-09: 60 mg via INTRATRACHEAL

## 2018-01-09 MED ORDER — ACETAMINOPHEN 500 MG PO TABS
1000.0000 mg | ORAL_TABLET | Freq: Four times a day (QID) | ORAL | Status: AC
  Administered 2018-01-09 – 2018-01-10 (×4): 1000 mg via ORAL
  Filled 2018-01-09 (×4): qty 2

## 2018-01-09 MED ORDER — CHLORHEXIDINE GLUCONATE 4 % EX LIQD: 60.0000 mL | Freq: Once | CUTANEOUS | Status: DC

## 2018-01-09 MED ORDER — OXYCODONE HCL 5 MG/5ML PO SOLN: 5.0000 mg | Freq: Once | ORAL | Status: AC | PRN

## 2018-01-09 MED ORDER — HYDROMORPHONE HCL 1 MG/ML IJ SOLN
INTRAMUSCULAR | Status: AC
  Filled 2018-01-09: qty 1

## 2018-01-09 MED ORDER — ONDANSETRON HCL 4 MG/2ML IJ SOLN
INTRAMUSCULAR | Status: AC
  Filled 2018-01-09: qty 6

## 2018-01-09 MED ORDER — FLEET ENEMA 7-19 GM/118ML RE ENEM: 1.0000 | ENEMA | Freq: Once | RECTAL | Status: DC | PRN

## 2018-01-09 MED ORDER — OXYCODONE HCL 5 MG PO TABS: ORAL_TABLET | ORAL | 0 refills | Status: AC

## 2018-01-09 MED ORDER — OXYCODONE HCL 5 MG PO TABS
ORAL_TABLET | ORAL | Status: AC
  Filled 2018-01-09: qty 1

## 2018-01-09 MED ORDER — SODIUM CHLORIDE 0.9% FLUSH
INTRAVENOUS | Status: DC | PRN
  Administered 2018-01-09: 50 mL

## 2018-01-09 MED ORDER — MIDAZOLAM HCL 5 MG/5ML IJ SOLN
INTRAMUSCULAR | Status: DC | PRN
  Administered 2018-01-09: 2 mg via INTRAVENOUS

## 2018-01-09 MED ORDER — APIXABAN 2.5 MG PO TABS
2.5000 mg | ORAL_TABLET | Freq: Two times a day (BID) | ORAL | Status: DC
  Administered 2018-01-10 – 2018-01-13 (×7): 2.5 mg via ORAL
  Filled 2018-01-09 (×7): qty 1

## 2018-01-09 MED ORDER — PROPOFOL 10 MG/ML IV BOLUS
INTRAVENOUS | Status: DC | PRN
  Administered 2018-01-09: 200 mg via INTRAVENOUS

## 2018-01-09 MED ORDER — SUGAMMADEX SODIUM 200 MG/2ML IV SOLN
INTRAVENOUS | Status: AC
  Filled 2018-01-09: qty 4

## 2018-01-09 MED ORDER — BUPIVACAINE-EPINEPHRINE 0.5% -1:200000 IJ SOLN
INTRAMUSCULAR | Status: DC | PRN
  Administered 2018-01-09: 50 mL

## 2018-01-09 MED ORDER — APIXABAN 2.5 MG PO TABS: 2.5000 mg | ORAL_TABLET | Freq: Two times a day (BID) | ORAL | 0 refills | Status: AC

## 2018-01-09 MED ORDER — TRANEXAMIC ACID 1000 MG/10ML IV SOLN
1000.0000 mg | Freq: Once | INTRAVENOUS | Status: AC
  Administered 2018-01-09: 1000 mg via INTRAVENOUS
  Filled 2018-01-09: qty 10

## 2018-01-09 MED ORDER — SORBITOL 70 % SOLN: 30.0000 mL | Freq: Every day | Status: DC | PRN

## 2018-01-09 MED ORDER — ONDANSETRON HCL 4 MG PO TABS: 4.0000 mg | ORAL_TABLET | Freq: Four times a day (QID) | ORAL | Status: DC | PRN

## 2018-01-09 MED ORDER — HYDROMORPHONE HCL 1 MG/ML IJ SOLN
0.2500 mg | INTRAMUSCULAR | Status: DC | PRN
  Administered 2018-01-09 (×3): 0.5 mg via INTRAVENOUS

## 2018-01-09 MED ORDER — SUGAMMADEX SODIUM 200 MG/2ML IV SOLN
INTRAVENOUS | Status: DC | PRN
  Administered 2018-01-09: 250 mg via INTRAVENOUS

## 2018-01-09 MED ORDER — PROPOFOL 10 MG/ML IV BOLUS
INTRAVENOUS | Status: AC
  Filled 2018-01-09: qty 20

## 2018-01-09 MED ORDER — FENTANYL CITRATE (PF) 100 MCG/2ML IJ SOLN
INTRAMUSCULAR | Status: DC | PRN
  Administered 2018-01-09 (×7): 50 ug via INTRAVENOUS
  Administered 2018-01-09: 100 ug via INTRAVENOUS
  Administered 2018-01-09: 50 ug via INTRAVENOUS

## 2018-01-09 MED ORDER — METOPROLOL SUCCINATE ER 25 MG PO TB24
25.0000 mg | ORAL_TABLET | Freq: Every day | ORAL | Status: DC
  Administered 2018-01-10 – 2018-01-13 (×4): 25 mg via ORAL
  Filled 2018-01-09 (×4): qty 1

## 2018-01-09 MED ORDER — ACETAMINOPHEN 325 MG PO TABS
325.0000 mg | ORAL_TABLET | Freq: Four times a day (QID) | ORAL | Status: AC | PRN
  Administered 2018-01-10 – 2018-01-11 (×4): 650 mg via ORAL
  Filled 2018-01-09 (×4): qty 2

## 2018-01-09 MED ORDER — DEXTROSE 5 % IV SOLN: 500.0000 mg | Freq: Four times a day (QID) | INTRAVENOUS | Status: DC | PRN

## 2018-01-09 MED ORDER — DOCUSATE SODIUM 100 MG PO CAPS
100.0000 mg | ORAL_CAPSULE | Freq: Two times a day (BID) | ORAL | Status: DC
  Administered 2018-01-09 – 2018-01-13 (×7): 100 mg via ORAL
  Filled 2018-01-09 (×7): qty 1

## 2018-01-09 MED ORDER — FENTANYL CITRATE (PF) 250 MCG/5ML IJ SOLN
INTRAMUSCULAR | Status: AC
Start: 2018-01-09 — End: 2018-01-09
  Filled 2018-01-09: qty 5

## 2018-01-09 MED ORDER — SENNOSIDES-DOCUSATE SODIUM 8.6-50 MG PO TABS
1.0000 | ORAL_TABLET | Freq: Every evening | ORAL | Status: DC | PRN
Start: 2018-01-09 — End: 2018-01-13
  Administered 2018-01-12: 1 via ORAL
  Filled 2018-01-09: qty 1

## 2018-01-09 MED ORDER — METOCLOPRAMIDE HCL 5 MG PO TABS: 5.0000 mg | ORAL_TABLET | Freq: Three times a day (TID) | ORAL | Status: DC | PRN

## 2018-01-09 MED ORDER — PHENOL 1.4 % MT LIQD: 1.0000 | OROMUCOSAL | Status: DC | PRN

## 2018-01-09 MED ORDER — METOCLOPRAMIDE HCL 5 MG/ML IJ SOLN: 5.0000 mg | Freq: Three times a day (TID) | INTRAMUSCULAR | Status: DC | PRN

## 2018-01-09 MED ORDER — ONDANSETRON HCL 4 MG/2ML IJ SOLN
INTRAMUSCULAR | Status: DC | PRN
  Administered 2018-01-09: 4 mg via INTRAVENOUS

## 2018-01-09 MED ORDER — FENTANYL CITRATE (PF) 250 MCG/5ML IJ SOLN
INTRAMUSCULAR | Status: AC
  Filled 2018-01-09: qty 5

## 2018-01-09 MED ORDER — ROPIVACAINE HCL 7.5 MG/ML IJ SOLN
INTRAMUSCULAR | Status: DC | PRN
  Administered 2018-01-09: 20 mL via PERINEURAL

## 2018-01-09 MED ORDER — LACTATED RINGERS IV SOLN
INTRAVENOUS | Status: DC | PRN
  Administered 2018-01-09 (×2): via INTRAVENOUS

## 2018-01-09 MED ORDER — SODIUM CHLORIDE 0.9 % IV SOLN: INTRAVENOUS | Status: DC

## 2018-01-09 MED ORDER — OXYCODONE HCL 5 MG PO TABS
5.0000 mg | ORAL_TABLET | ORAL | Status: DC | PRN
  Administered 2018-01-09 – 2018-01-13 (×20): 10 mg via ORAL
  Filled 2018-01-09 (×20): qty 2
  Filled 2018-01-09: qty 1
  Filled 2018-01-09 (×2): qty 2

## 2018-01-09 MED ORDER — DEXAMETHASONE SODIUM PHOSPHATE 10 MG/ML IJ SOLN
INTRAMUSCULAR | Status: DC | PRN
  Administered 2018-01-09: 10 mg via INTRAVENOUS

## 2018-01-09 MED ORDER — MENTHOL 3 MG MT LOZG: 1.0000 | LOZENGE | OROMUCOSAL | Status: DC | PRN

## 2018-01-09 MED ORDER — METHOCARBAMOL 500 MG PO TABS: 500.0000 mg | ORAL_TABLET | Freq: Four times a day (QID) | ORAL | 0 refills | Status: AC | PRN

## 2018-01-09 MED ORDER — 0.9 % SODIUM CHLORIDE (POUR BTL) OPTIME
TOPICAL | Status: DC | PRN
  Administered 2018-01-09: 2000 mL

## 2018-01-09 MED ORDER — ROCURONIUM BROMIDE 100 MG/10ML IV SOLN
INTRAVENOUS | Status: DC | PRN
  Administered 2018-01-09: 50 mg via INTRAVENOUS
  Administered 2018-01-09: 10 mg via INTRAVENOUS

## 2018-01-09 MED ORDER — SODIUM CHLORIDE 0.9 % IV SOLN
INTRAVENOUS | Status: DC
  Administered 2018-01-09: 12:00:00 via INTRAVENOUS

## 2018-01-09 MED ORDER — OXYCODONE HCL 5 MG PO TABS
5.0000 mg | ORAL_TABLET | Freq: Once | ORAL | Status: AC | PRN
  Administered 2018-01-09: 5 mg via ORAL

## 2018-01-09 MED ORDER — METHOCARBAMOL 500 MG PO TABS
500.0000 mg | ORAL_TABLET | Freq: Four times a day (QID) | ORAL | Status: DC | PRN
  Administered 2018-01-09 – 2018-01-13 (×14): 500 mg via ORAL
  Filled 2018-01-09 (×15): qty 1

## 2018-01-09 MED ORDER — PROMETHAZINE HCL 25 MG/ML IJ SOLN: 6.2500 mg | INTRAMUSCULAR | Status: DC | PRN

## 2018-01-09 MED ORDER — HYDROMORPHONE HCL 1 MG/ML IJ SOLN
0.5000 mg | INTRAMUSCULAR | Status: DC | PRN
  Administered 2018-01-09 (×2): 1 mg via INTRAVENOUS
  Administered 2018-01-10: 0.5 mg via INTRAVENOUS
  Administered 2018-01-10: 1 mg via INTRAVENOUS
  Administered 2018-01-10: 0.5 mg via INTRAVENOUS
  Administered 2018-01-10: 1 mg via INTRAVENOUS
  Administered 2018-01-10 – 2018-01-12 (×8): 0.5 mg via INTRAVENOUS
  Filled 2018-01-09 (×14): qty 1

## 2018-01-09 MED ORDER — VANCOMYCIN HCL IN DEXTROSE 1-5 GM/200ML-% IV SOLN
1000.0000 mg | Freq: Two times a day (BID) | INTRAVENOUS | Status: AC
  Administered 2018-01-09: 1000 mg via INTRAVENOUS
  Filled 2018-01-09: qty 200

## 2018-01-09 MED ORDER — MIDAZOLAM HCL 2 MG/2ML IJ SOLN
INTRAMUSCULAR | Status: AC
  Filled 2018-01-09: qty 2

## 2018-01-09 SURGICAL SUPPLY — 64 items
BANDAGE ACE 4X5 VEL STRL LF (GAUZE/BANDAGES/DRESSINGS) ×2 IMPLANT
BANDAGE ACE 6X5 VEL STRL LF (GAUZE/BANDAGES/DRESSINGS) ×2 IMPLANT
BANDAGE ESMARK 6X9 LF (GAUZE/BANDAGES/DRESSINGS) ×1 IMPLANT
BLADE SAGITTAL 25.0X1.19X90 (BLADE) ×2 IMPLANT
BLADE SAW SAG 90X13X1.27 (BLADE) ×2 IMPLANT
BNDG ESMARK 6X9 LF (GAUZE/BANDAGES/DRESSINGS) ×2
BONE CEMENT GENTAMICIN (Cement) ×4 IMPLANT
BOWL SMART MIX CTS (DISPOSABLE) ×2 IMPLANT
CAP KNEE TOTAL 3 SIGMA ×2 IMPLANT
CEMENT BONE GENTAMICIN 40 (Cement) ×2 IMPLANT
CEMENT HV SMART SET (Cement) IMPLANT
COVER SURGICAL LIGHT HANDLE (MISCELLANEOUS) ×2 IMPLANT
CUFF TOURNIQUET SINGLE 34IN LL (TOURNIQUET CUFF) ×2 IMPLANT
CUFF TOURNIQUET SINGLE 44IN (TOURNIQUET CUFF) IMPLANT
DRAPE INCISE IOBAN 66X45 STRL (DRAPES) IMPLANT
DRAPE ORTHO SPLIT 77X108 STRL (DRAPES) ×2
DRAPE SURG ORHT 6 SPLT 77X108 (DRAPES) ×2 IMPLANT
DRAPE U-SHAPE 47X51 STRL (DRAPES) ×2 IMPLANT
DRSG ADAPTIC 3X8 NADH LF (GAUZE/BANDAGES/DRESSINGS) ×2 IMPLANT
DRSG PAD ABDOMINAL 8X10 ST (GAUZE/BANDAGES/DRESSINGS) ×4 IMPLANT
DURAPREP 26ML APPLICATOR (WOUND CARE) ×4 IMPLANT
ELECT REM PT RETURN 9FT ADLT (ELECTROSURGICAL) ×2
ELECTRODE REM PT RTRN 9FT ADLT (ELECTROSURGICAL) ×1 IMPLANT
EVACUATOR 1/8 PVC DRAIN (DRAIN) IMPLANT
FACESHIELD WRAPAROUND (MASK) ×2 IMPLANT
GAUZE SPONGE 4X4 12PLY STRL (GAUZE/BANDAGES/DRESSINGS) ×2 IMPLANT
GLOVE BIOGEL PI IND STRL 8 (GLOVE) ×4 IMPLANT
GLOVE BIOGEL PI INDICATOR 8 (GLOVE) ×4
GLOVE ORTHO TXT STRL SZ7.5 (GLOVE) ×2 IMPLANT
GLOVE SURG ORTHO 8.0 STRL STRW (GLOVE) ×2 IMPLANT
GOWN STRL REUS W/ TWL LRG LVL3 (GOWN DISPOSABLE) ×1 IMPLANT
GOWN STRL REUS W/ TWL XL LVL3 (GOWN DISPOSABLE) ×1 IMPLANT
GOWN STRL REUS W/TWL 2XL LVL3 (GOWN DISPOSABLE) ×2 IMPLANT
GOWN STRL REUS W/TWL LRG LVL3 (GOWN DISPOSABLE) ×1
GOWN STRL REUS W/TWL XL LVL3 (GOWN DISPOSABLE) ×1
HANDPIECE INTERPULSE COAX TIP (DISPOSABLE) ×1
HOOD PEEL AWAY FACE SHEILD DIS (HOOD) ×2 IMPLANT
IMMOBILIZER KNEE 22 UNIV (SOFTGOODS) ×2 IMPLANT
KIT BASIN OR (CUSTOM PROCEDURE TRAY) ×2 IMPLANT
KIT ROOM TURNOVER OR (KITS) ×2 IMPLANT
MANIFOLD NEPTUNE II (INSTRUMENTS) ×2 IMPLANT
NEEDLE 22X1 1/2 (OR ONLY) (NEEDLE) ×4 IMPLANT
NS IRRIG 1000ML POUR BTL (IV SOLUTION) ×4 IMPLANT
PACK TOTAL JOINT (CUSTOM PROCEDURE TRAY) ×2 IMPLANT
PAD ABD 8X10 STRL (GAUZE/BANDAGES/DRESSINGS) ×2 IMPLANT
PAD ARMBOARD 7.5X6 YLW CONV (MISCELLANEOUS) ×4 IMPLANT
PAD CAST 4YDX4 CTTN HI CHSV (CAST SUPPLIES) ×1 IMPLANT
PADDING CAST COTTON 4X4 STRL (CAST SUPPLIES) ×1
PADDING CAST COTTON 6X4 STRL (CAST SUPPLIES) ×2 IMPLANT
SET HNDPC FAN SPRY TIP SCT (DISPOSABLE) ×1 IMPLANT
STAPLER VISISTAT 35W (STAPLE) ×4 IMPLANT
SUCTION FRAZIER HANDLE 10FR (MISCELLANEOUS) ×1
SUCTION TUBE FRAZIER 10FR DISP (MISCELLANEOUS) ×1 IMPLANT
SUT ETHIBOND NAB CT1 #1 30IN (SUTURE) ×6 IMPLANT
SUT VIC AB 0 CT1 27 (SUTURE) ×1
SUT VIC AB 0 CT1 27XBRD ANBCTR (SUTURE) ×1 IMPLANT
SUT VIC AB 2-0 CT1 27 (SUTURE) ×2
SUT VIC AB 2-0 CT1 TAPERPNT 27 (SUTURE) ×2 IMPLANT
SYR CONTROL 10ML LL (SYRINGE) ×4 IMPLANT
TOWEL GREEN STERILE (TOWEL DISPOSABLE) ×2 IMPLANT
TRAY CATH 16FR W/PLASTIC CATH (SET/KITS/TRAYS/PACK) IMPLANT
TRAY FOLEY CATH SILVER 14FR (SET/KITS/TRAYS/PACK) ×2 IMPLANT
TRAY FOLEY W/METER SILVER 16FR (SET/KITS/TRAYS/PACK) IMPLANT
WATER STERILE IRR 1000ML POUR (IV SOLUTION) ×2 IMPLANT

## 2018-01-09 NOTE — Progress Notes (Signed)
Pt given dillaudid and oxy IR, yellling and cussing at myself and charge Rn, WIll make Dr Bradley FerrisEllender aware , pt screaming out at others passing by bay

## 2018-01-09 NOTE — Evaluation (Signed)
Physical Therapy Evaluation Patient Details Name: Paige Jenkins MRN: [id number] DOB: 06/07/1951 Today's Date: 01/09/2018   History of Present Illness  67 yo admitted for R TKA. PMHx: HTN, depression, obesity  Clinical Impression  Pt initially resistant to attempt therapy despite premedication but then agreeable to attempt HEP and mobility. Pt with ability to roll and move in bed but could not achieve EOB and pt without significant ROM Right knee at this time. Pt educated for no pillow under knee, need to increase ROM, and importance of mobility and progression. Pt verbalized understanding but stating right groin pain with mobility and unable to fully achieve EOb. Pt with above and below deficits who will benefit from acute therapy to maximize mobility, function, ROM and gait to decrease burden of care.      Follow Up Recommendations SNF;Supervision/Assistance - 24 hour    Equipment Recommendations  None recommended by PT    Recommendations for Other Services       Precautions / Restrictions Precautions Precautions: Fall;Knee Restrictions Weight Bearing Restrictions: Yes RLE Weight Bearing: Weight bearing as tolerated      Mobility  Bed Mobility Overal bed mobility: Needs Assistance Bed Mobility: Rolling;Sidelying to Sit Rolling: Min assist Sidelying to sit: Min assist;HOB elevated       General bed mobility comments: pt able to bridge with LLE and [facility] and [facility] edge. Pt with assist for RLE off of EOB to achieve sitting but once pt got halfway to EOB x 3 trials pt reports right groin cramping and refused further mobility with using rolling and pivoting as sequences. Pt [facility] with rails and cues  Transfers                 General transfer comment: unable  Ambulation/Gait                Stairs            Wheelchair Mobility    Modified Rankin (Stroke Patients Only)       Balance                                              Pertinent Vitals/Pain Pain Assessment: 0-10 Pain Score: 9  Pain Location: right groin and knee Pain Descriptors / Indicators: Aching;Cramping Pain Intervention(s): Limited activity within patient's tolerance;Repositioned;Premedicated before session;Monitored during session    Home Living Family/patient expects to be discharged to:: Skilled nursing facility Living Arrangements: Children Available Help at Discharge: Family Type of Home: Apartment         Home Equipment: Dan HumphreysWalker - 2 wheels;Cane - single point;Wheelchair - manual;Shower seat Additional Comments: pt was living with daughter in an apartment but states they had a parting of the ways and she has no where to go at D/C    Prior Function Level of Independence: Independent with assistive device(s);Needs assistance   Gait / Transfers Assistance Needed: pt reports only walking 5' for the last 4months and relying on WC  ADL's / Homemaking Assistance Needed: pt was using shower seat and sitting to dress, increased time to don shoes, sits to cook, doesn't clean (daughter was doing the cleaning)s  Comments: pt uses cane, RW and WC     Hand Dominance        Extremity/Trunk Assessment   Upper Extremity Assessment Upper Extremity Assessment: Generalized weakness  Lower Extremity Assessment Lower Extremity Assessment: Generalized weakness;RLE deficits/detail RLE Deficits / Details: no appreciable AROM right knee trace quad and hamstring,  WFL for hip ROM       Communication   Communication: No difficulties  Cognition Arousal/Alertness: Awake/alert Behavior During Therapy: WFL for tasks assessed/performed Overall Cognitive Status: Impaired/Different from baseline Area of Impairment: Problem solving;Safety/judgement                         Safety/Judgement: Decreased awareness of deficits   Problem Solving: Slow processing        General Comments      Exercises Total Joint  Exercises Ankle Circles/Pumps: AROM;10 reps;Right;Supine Quad Sets: AAROM;5 reps;Right;Supine(very limited activation) Heel Slides: PROM;5 reps;Right;Supine(grossly 5 degrees as only tolerable range)   Assessment/Plan    PT Assessment Patient needs continued PT services  PT Problem List Decreased strength;Decreased mobility;Decreased range of motion;Decreased activity tolerance;Decreased knowledge of use of DME;Pain;Obesity;Decreased knowledge of precautions       PT Treatment Interventions DME instruction;Therapeutic activities;Gait training;Therapeutic exercise;Patient/family education;Functional mobility training    PT Goals (Current goals can be found in the Care Plan section)  Acute Rehab PT Goals Patient Stated Goal: be able to return to the pool PT Goal Formulation: With patient Time For Goal Achievement: 01/23/18 Potential to Achieve Goals: Fair    Frequency 7X/week   Barriers to discharge Decreased caregiver support      Co-evaluation               AM-PAC PT "6 Clicks" Daily Activity  Outcome Measure Difficulty turning over in bed (including adjusting bedclothes, sheets and blankets)?: A Lot Difficulty moving from lying on back to sitting on the side of the bed? : Unable Difficulty sitting down on and standing up from a chair with arms (e.g., wheelchair, bedside commode, etc,.)?: Unable Help needed moving to and from a bed to chair (including a wheelchair)?: Total Help needed walking in hospital room?: Total Help needed climbing 3-5 steps with a railing? : Total 6 Click Score: 7    End of Session   Activity Tolerance: Patient limited by pain Patient left: in bed;with call bell/phone within reach Nurse Communication: Mobility status;Precautions PT Visit Diagnosis: Other abnormalities of gait and mobility (R26.89);Muscle weakness (generalized) (M62.81)    Time: 1610-9604 PT Time Calculation (min) (ACUTE ONLY): 38 min   Charges:   PT Evaluation $PT Eval  Moderate Complexity: 1 Mod PT Treatments $Therapeutic Activity: 8-22 mins   PT G Codes:        Delaney Meigs, PT [phone]   Inza Mikrut B Penelopi Mikrut 01/09/2018, 1:08 PM

## 2018-01-09 NOTE — Plan of Care (Signed)
  Nutrition: Adequate nutrition will be maintained 01/09/2018 1347 - Progressing by Darrow BussingArcilla, Rhiley Solem M, RN   Coping: Level of anxiety will decrease 01/09/2018 1347 - Progressing by Darrow BussingArcilla, Devery Odwyer M, RN   Elimination: Will not experience complications related to bowel motility 01/09/2018 1347 - Progressing by Darrow BussingArcilla, Sanskriti Greenlaw M, RN   Pain Managment: General experience of comfort will improve 01/09/2018 1347 - Progressing by Darrow BussingArcilla, Carlisle Enke M, RN   Safety: Ability to remain free from injury will improve 01/09/2018 1347 - Progressing by Darrow BussingArcilla, Meerab Maselli M, RN

## 2018-01-09 NOTE — Anesthesia Procedure Notes (Signed)
Procedure Name: Intubation Date/Time: 01/09/2018 7:52 AM Performed by: Neldon Newport, CRNA Pre-anesthesia Checklist: Timeout performed, Patient being monitored, Suction available, Emergency Drugs available and Patient identified Patient Re-evaluated:Patient Re-evaluated prior to induction Oxygen Delivery Method: Circle system utilized Preoxygenation: Pre-oxygenation with 100% oxygen Induction Type: IV induction Ventilation: Mask ventilation without difficulty Laryngoscope Size: Mac and 3 Grade View: Grade I Tube type: Oral Tube size: 7.0 mm Number of attempts: 1 Placement Confirmation: breath sounds checked- equal and bilateral,  positive ETCO2 and ETT inserted through vocal cords under direct vision Secured at: 22 cm Tube secured with: Tape Dental Injury: Teeth and Oropharynx as per pre-operative assessment

## 2018-01-09 NOTE — Interval H&P Note (Signed)
History and Physical Interval Note:  01/09/2018 7:32 AM  Paige Jenkins  has presented today for surgery, with the diagnosis of OA RIGHT KNEE  The various methods of treatment have been discussed with the patient and family. After consideration of risks, benefits and other options for treatment, the patient has consented to  Procedure(s): TOTAL KNEE ARTHROPLASTY (Right) as a surgical intervention .  The patient's history has been reviewed, patient examined, no change in status, stable for surgery.  I have reviewed the patient's chart and labs.  Questions were answered to the patient's satisfaction.     Thera FlakeW D Keiko Myricks Jr

## 2018-01-09 NOTE — Transfer of Care (Signed)
Immediate Anesthesia Transfer of Care Note  Patient: Paige Jenkins  Procedure(s) Performed: TOTAL KNEE ARTHROPLASTY (Right Knee)  Patient Location: PACU  Anesthesia Type:General  Level of Consciousness: awake, alert  and oriented  Airway & Oxygen Therapy: Patient Spontanous Breathing and Patient connected to face mask oxygen  Post-op Assessment: Report given to RN, Post -op Vital signs reviewed and stable and Patient moving all extremities X 4  Post vital signs: Reviewed and stable  Last Vitals:  Vitals:   01/09/18 0626 01/09/18 0627  BP:  (!) 145/66  Pulse: 74   Resp: 20   Temp: 36.9 C   SpO2: 100%     Last Pain:  Vitals:   01/09/18 0626  TempSrc: Oral  PainSc:       Patients Stated Pain Goal: 2 (01/09/18 [id number])  Complications: No apparent anesthesia complications

## 2018-01-09 NOTE — Anesthesia Postprocedure Evaluation (Signed)
Anesthesia Post Note  Patient: Parish L Percle  Procedure(s) Performed: TOTAL KNEE ARTHROPLASTY (Right Knee)     Patient location during evaluation: PACU Anesthesia Type: General and Regional Level of consciousness: awake and alert Pain management: pain level controlled Vital Signs Assessment: post-procedure vital signs reviewed and stable Respiratory status: spontaneous breathing, nonlabored ventilation, respiratory function stable and patient connected to nasal cannula oxygen Cardiovascular status: blood pressure returned to baseline and stable Postop Assessment: no apparent nausea or vomiting Anesthetic complications: no    Last Vitals:  Vitals:   01/09/18 1117 01/09/18 1148  BP:  (!) 173/90  Pulse:  72  Resp:  16  Temp: (!) 36.1 C 36.4 C  SpO2:  92%    Last Pain:  Vitals:   01/09/18 1148  TempSrc: Oral  PainSc:                  Ryan P Ellender

## 2018-01-09 NOTE — Progress Notes (Signed)
Redness noted at IV site. IV removed and order placed for new IV to be inserted.

## 2018-01-09 NOTE — Op Note (Signed)
Preop diagnosis osteoarthritis right knee postop the same  Operation: Stigma submitted right total knee replacement (size 3 femur tibia size 338 patella 12.5 mm bearing Surgeon French Ana assistant [place] will PA of tourniquet time 74 minutes  Sterile prep and drape exsanguination of the leg inflations of the tourniquet to 375 mmHg straight skin incision was made with the medial meet a medial parapatellar approach to the knee.  We identified the most diseased medial compartment guarding about a 4 mm cup below the tibia cut a 5 degree 11 mm cut on the femur with the extension gap measured at 12-1/2 mm.  Tibia was sized to be a size 3 and a box cut was made for the femur.  Patella was cut leaving 14 mm of native patella.  Trials were placed in full extension was noted with resolution of the back varus deformity.  Cement was prepared on the back table in the doughy state with antibiotic rhinitis met with the patient emergently in the dorsal positive.  We placed the components in the tibia followed by femur and patella all parameters seem to be acceptable trial bearing was removed excess cement was removed from the knee ventricular tourniquet was released under direct vision and no excessive bleeding was noted final components were inserted with 12.5 mm bearing.  Closure was affected of the capsule with #1 Ethibond 202 0 Vicryl and skin clips like a compressive sterile dressing applied stable condition

## 2018-01-09 NOTE — Anesthesia Procedure Notes (Signed)
Anesthesia Regional Block: Adductor canal block   Pre-Anesthetic Checklist: ,, timeout performed, Correct Patient, Correct Site, Correct Laterality, Correct Procedure,, site marked, risks and benefits discussed, Surgical consent,  Pre-op evaluation,  At surgeon's request and post-op pain management  Laterality: Right  Prep: chloraprep       Needles:  Injection technique: Single-shot  Needle Type: Echogenic Stimulator Needle     Needle Length: 10cm  Needle Gauge: 21     Additional Needles:   Procedures:,,,, ultrasound used (permanent image in chart),,,,  Narrative:  Start time: 01/09/2018 7:35 AM End time: 01/09/2018 7:45 AM Injection made incrementally with aspirations every 5 mL.  Performed by: Personally  Anesthesiologist: Leonides GrillsEllender, Aayla Marrocco P, MD  Additional Notes: Functioning IV was confirmed and monitors were applied.  A 100mm 21ga Pajunk echogenic stimulator needle was used. Sterile prep, hand hygiene and sterile gloves were used.  Negative aspiration and negative test dose prior to incremental administration of local anesthetic. The patient tolerated the procedure well.

## 2018-01-09 NOTE — Discharge Instructions (Signed)
INSTRUCTIONS AFTER JOINT REPLACEMENT  ° °o Remove items at home which could result in a fall. This includes throw rugs or furniture in walking pathways °o ICE to the affected joint every three hours while awake for 30 minutes at a time, for at least the first 3-5 days, and then as needed for pain and swelling.  Continue to use ice for pain and swelling. You may notice swelling that will progress down to the foot and ankle.  This is normal after surgery.  Elevate your leg when you are not up walking on it.   °o Continue to use the breathing machine you got in the hospital (incentive spirometer) which will help keep your temperature down.  It is common for your temperature to cycle up and down following surgery, especially at night when you are not up moving around and exerting yourself.  The breathing machine keeps your lungs expanded and your temperature down. ° ° °DIET:  As you were doing prior to hospitalization, we recommend a well-balanced diet. ° °DRESSING / WOUND CARE / SHOWERING ° °You may change your dressing 3-5 days after surgery.  Then change the dressing every day with sterile gauze.  Please use good hand washing techniques before changing the dressing.  Do not use any lotions or creams on the incision until instructed by your surgeon. ° °ACTIVITY ° °o Increase activity slowly as tolerated, but follow the weight bearing instructions below.   °o No driving for 6 weeks or until further direction given by your physician.  You cannot drive while taking narcotics.  °o No lifting or carrying greater than 10 lbs. until further directed by your surgeon. °o Avoid periods of inactivity such as sitting longer than an hour when not asleep. This helps prevent blood clots.  °o You may return to work once you are authorized by your doctor.  ° ° ° °WEIGHT BEARING  ° °Weight bearing as tolerated with assist device (walker, cane, etc) as directed, use it as long as suggested by your surgeon or therapist, typically at  least 4-6 weeks. ° ° °EXERCISES ° °Results after joint replacement surgery are often greatly improved when you follow the exercise, range of motion and muscle strengthening exercises prescribed by your doctor. Safety measures are also important to protect the joint from further injury. Any time any of these exercises cause you to have increased pain or swelling, decrease what you are doing until you are comfortable again and then slowly increase them. If you have problems or questions, call your caregiver or physical therapist for advice.  ° °Rehabilitation is important following a joint replacement. After just a few days of immobilization, the muscles of the leg can become weakened and shrink (atrophy).  These exercises are designed to build up the tone and strength of the thigh and leg muscles and to improve motion. Often times heat used for twenty to thirty minutes before working out will loosen up your tissues and help with improving the range of motion but do not use heat for the first two weeks following surgery (sometimes heat can increase post-operative swelling).  ° °These exercises can be done on a training (exercise) mat, on the floor, on a table or on a bed. Use whatever works the best and is most comfortable for you.    Use music or television while you are exercising so that the exercises are a pleasant break in your day. This will make your life better with the exercises acting as a break   in your routine that you can look forward to.   Perform all exercises about fifteen times, three times per day or as directed.  You should exercise both the operative leg and the other leg as well. ° °Exercises include: °  °• Quad Sets - Tighten up the muscle on the front of the thigh (Quad) and hold for 5-10 seconds.   °• Straight Leg Raises - With your knee straight (if you were given a brace, keep it on), lift the leg to 60 degrees, hold for 3 seconds, and slowly lower the leg.  Perform this exercise against  resistance later as your leg gets stronger.  °• Leg Slides: Lying on your back, slowly slide your foot toward your buttocks, bending your knee up off the floor (only go as far as is comfortable). Then slowly slide your foot back down until your leg is flat on the floor again.  °• Angel Wings: Lying on your back spread your legs to the side as far apart as you can without causing discomfort.  °• Hamstring Strength:  Lying on your back, push your heel against the floor with your leg straight by tightening up the muscles of your buttocks.  Repeat, but this time bend your knee to a comfortable angle, and push your heel against the floor.  You may put a pillow under the heel to make it more comfortable if necessary.  ° °A rehabilitation program following joint replacement surgery can speed recovery and prevent re-injury in the future due to weakened muscles. Contact your doctor or a physical therapist for more information on knee rehabilitation.  ° ° °CONSTIPATION ° °Constipation is defined medically as fewer than three stools per week and severe constipation as less than one stool per week.  Even if you have a regular bowel pattern at home, your normal regimen is likely to be disrupted due to multiple reasons following surgery.  Combination of anesthesia, postoperative narcotics, change in appetite and fluid intake all can affect your bowels.  ° °YOU MUST use at least one of the following options; they are listed in order of increasing strength to get the job done.  They are all available over the counter, and you may need to use some, POSSIBLY even all of these options:   ° °Drink plenty of fluids (prune juice may be helpful) and high fiber foods °Colace 100 mg by mouth twice a day  °Senokot for constipation as directed and as needed Dulcolax (bisacodyl), take with full glass of water  °Miralax (polyethylene glycol) once or twice a day as needed. ° °If you have tried all these things and are unable to have a bowel  movement in the first 3-4 days after surgery call either your surgeon or your primary doctor.   ° °If you experience loose stools or diarrhea, hold the medications until you stool forms back up.  If your symptoms do not get better within 1 week or if they get worse, check with your doctor.  If you experience "the worst abdominal pain ever" or develop nausea or vomiting, please contact the office immediately for further recommendations for treatment. ° ° °ITCHING:  If you experience itching with your medications, try taking only a single pain pill, or even half a pain pill at a time.  You can also use Benadryl over the counter for itching or also to help with sleep.  ° °TED HOSE STOCKINGS:  Use stockings on both legs until for at least 2 weeks or as   directed by physician office. They may be removed at night for sleeping. ° °MEDICATIONS:  See your medication summary on the “After Visit Summary” that nursing will review with you.  You may have some home medications which will be placed on hold until you complete the course of blood thinner medication.  It is important for you to complete the blood thinner medication as prescribed. ° °PRECAUTIONS:  If you experience chest pain or shortness of breath - call 911 immediately for transfer to the hospital emergency department.  ° °If you develop a fever greater that 101 F, purulent drainage from wound, increased redness or drainage from wound, foul odor from the wound/dressing, or calf pain - CONTACT YOUR SURGEON.   °                                                °FOLLOW-UP APPOINTMENTS:  If you do not already have a post-op appointment, please call the office for an appointment to be seen by your surgeon.  Guidelines for how soon to be seen are listed in your “After Visit Summary”, but are typically between 1-4 weeks after surgery. ° °OTHER INSTRUCTIONS:  ° °Knee Replacement:  Do not place pillow under knee, focus on keeping the knee straight while resting. CPM  instructions: 0-90 degrees, 2 hours in the morning, 2 hours in the afternoon, and 2 hours in the evening. Place foam block, curve side up under heel at all times except when in CPM or when walking.  DO NOT modify, tear, cut, or change the foam block in any way. ° °MAKE SURE YOU:  °• Understand these instructions.  °• Get help right away if you are not doing well or get worse.  ° ° °Thank you for letting us be a part of your medical care team.  It is a privilege we respect greatly.  We hope these instructions will help you stay on track for a fast and full recovery!  ° ° ° ° ° °Information on my medicine - ELIQUIS® (apixaban) ° °Why was Eliquis® prescribed for you? °Eliquis® was prescribed for you to reduce the risk of blood clots forming after orthopedic surgery.   ° °What do You need to know about Eliquis®? °Take your Eliquis® TWICE DAILY - one tablet in the morning and one tablet in the evening with or without food.  It would be best to take the dose about the same time each day. ° °If you have difficulty swallowing the tablet whole please discuss with your pharmacist how to take the medication safely. ° °Take Eliquis® exactly as prescribed by your doctor and DO NOT stop taking Eliquis® without talking to the doctor who prescribed the medication.  Stopping without other medication to take the place of Eliquis® may increase your risk of developing a clot. ° °After discharge, you should have regular check-up appointments with your healthcare provider that is prescribing your Eliquis®. ° °What do you do if you miss a dose? °If a dose of ELIQUIS® is not taken at the scheduled time, take it as soon as possible on the same day and twice-daily administration should be resumed.  The dose should not be doubled to make up for a missed dose.  Do not take more than one tablet of ELIQUIS at the same time. ° °Important Safety Information °A possible side effect of Eliquis®   is bleeding. You should call your healthcare provider  right away if you experience any of the following: °? Bleeding from an injury or your nose that does not stop. °? Unusual colored urine (red or dark brown) or unusual colored stools (red or black). °? Unusual bruising for unknown reasons. °? A serious fall or if you hit your head (even if there is no bleeding). ° °Some medicines may interact with Eliquis® and might increase your risk of bleeding or clotting while on Eliquis®. To help avoid this, consult your healthcare provider or pharmacist prior to using any new prescription or non-prescription medications, including herbals, vitamins, non-steroidal anti-inflammatory drugs (NSAIDs) and supplements. ° °This website has more information on Eliquis® (apixaban): http://www.eliquis.com/eliquis/home ° ° °

## 2018-01-09 NOTE — Progress Notes (Signed)
Dr Bradley FerrisEllender at bedside and said pt has had all of pacu meds available, pt still falling asleep at times,

## 2018-01-09 NOTE — Progress Notes (Signed)
Patient called nurse to room complaining of severe pain 10/10. Per patient dilaudid and oxycodone  does not help with her pain. Patient requesting for IM demerol and vistaril to control her pain.  MD paged . Awaiting call back

## 2018-01-09 NOTE — Progress Notes (Signed)
[place] back [place]. Per PA patient pain pills cannot be adjusted and she cannot order patient requested  IM demerol and vistaril. Patient informed that PA cannot order additional pain medication. Patient verbalizes understating. Ice pack applied to right knee.

## 2018-01-09 NOTE — Brief Op Note (Signed)
01/09/2018  10:13 AM  PATIENT:  Paige Jenkins  67 y.o. female  PRE-OPERATIVE DIAGNOSIS:  OA RIGHT KNEE  POST-OPERATIVE DIAGNOSIS:  OA RIGHT KNEE  PROCEDURE:  Procedure(s): TOTAL KNEE ARTHROPLASTY (Right)  SURGEON:  Surgeon(s) and Role:    * Frederico Hammanaffrey, Daniel, MD - Primary  PHYSICIAN ASSISTANT: Margart SicklesJoshua Loreley Schwall, PA-C  ASSISTANTS:   ANESTHESIA:   local, regional and general  EBL:  150 mL   BLOOD ADMINISTERED:none  DRAINS: none   LOCAL MEDICATIONS USED:  MARCAINE     SPECIMEN:  No Specimen  DISPOSITION OF SPECIMEN:  N/A  COUNTS:  YES  TOURNIQUET:   Total Tourniquet Time Documented: Thigh (Right) - 70 minutes Total: Thigh (Right) - 70 minutes   DICTATION: .Reubin Milanragon Dictation  PLAN OF CARE: Admit for overnight observation  PATIENT DISPOSITION:  PACU - hemodynamically stable.   Delay start of Pharmacological VTE agent (>24hrs) due to surgical blood loss or risk of bleeding: yes

## 2018-01-10 DIAGNOSIS — Z6841 Body Mass Index (BMI) 40.0 and over, adult: Secondary | ICD-10-CM | POA: Diagnosis not present

## 2018-01-10 DIAGNOSIS — Z91041 Radiographic dye allergy status: Secondary | ICD-10-CM | POA: Diagnosis not present

## 2018-01-10 DIAGNOSIS — Z905 Acquired absence of kidney: Secondary | ICD-10-CM | POA: Diagnosis not present

## 2018-01-10 DIAGNOSIS — M1711 Unilateral primary osteoarthritis, right knee: Secondary | ICD-10-CM | POA: Diagnosis present

## 2018-01-10 DIAGNOSIS — Z791 Long term (current) use of non-steroidal anti-inflammatories (NSAID): Secondary | ICD-10-CM | POA: Diagnosis not present

## 2018-01-10 DIAGNOSIS — R51 Headache: Secondary | ICD-10-CM | POA: Diagnosis present

## 2018-01-10 DIAGNOSIS — R1313 Dysphagia, pharyngeal phase: Secondary | ICD-10-CM | POA: Diagnosis not present

## 2018-01-10 DIAGNOSIS — K5900 Constipation, unspecified: Secondary | ICD-10-CM | POA: Diagnosis not present

## 2018-01-10 DIAGNOSIS — Z79899 Other long term (current) drug therapy: Secondary | ICD-10-CM | POA: Diagnosis not present

## 2018-01-10 DIAGNOSIS — I10 Essential (primary) hypertension: Secondary | ICD-10-CM | POA: Diagnosis not present

## 2018-01-10 DIAGNOSIS — Z471 Aftercare following joint replacement surgery: Secondary | ICD-10-CM | POA: Diagnosis not present

## 2018-01-10 DIAGNOSIS — Z7901 Long term (current) use of anticoagulants: Secondary | ICD-10-CM | POA: Diagnosis not present

## 2018-01-10 DIAGNOSIS — Z96651 Presence of right artificial knee joint: Secondary | ICD-10-CM | POA: Diagnosis not present

## 2018-01-10 DIAGNOSIS — Z91012 Allergy to eggs: Secondary | ICD-10-CM | POA: Diagnosis not present

## 2018-01-10 DIAGNOSIS — F329 Major depressive disorder, single episode, unspecified: Secondary | ICD-10-CM | POA: Diagnosis present

## 2018-01-10 DIAGNOSIS — R402413 Glasgow coma scale score 13-15, at hospital admission: Secondary | ICD-10-CM | POA: Diagnosis present

## 2018-01-10 LAB — BASIC METABOLIC PANEL
Anion gap: 10 (ref 5–15)
BUN: 7 mg/dL (ref 6–20)
CHLORIDE: 104 mmol/L (ref 101–111)
CO2: 21 mmol/L — ABNORMAL LOW (ref 22–32)
CREATININE: 0.64 mg/dL (ref 0.44–1.00)
Calcium: 8.3 mg/dL — ABNORMAL LOW (ref 8.9–10.3)
GFR calc Af Amer: >60 mL/min (ref >60)
GFR calc non Af Amer: >60 mL/min (ref >60)
GLUCOSE: 129 mg/dL — AB (ref 65–99)
Potassium: 4.1 mmol/L (ref 3.5–5.1)
SODIUM: 135 mmol/L (ref 135–145)

## 2018-01-10 LAB — CBC
HEMATOCRIT: 39.9 % (ref 36.0–46.0)
HEMOGLOBIN: 12.6 g/dL (ref 12.0–15.0)
MCH: 28.7 pg (ref 26.0–34.0)
MCHC: 31.6 g/dL (ref 30.0–36.0)
MCV: 90.9 fL (ref 78.0–100.0)
Platelets: 256 10*3/uL (ref 150–400)
RBC: 4.39 MIL/uL (ref 3.87–5.11)
RDW: 14.1 % (ref 11.5–15.5)
WBC: 10.9 10*3/uL — AB (ref 4.0–10.5)

## 2018-01-10 NOTE — Care Management Obs Status (Signed)
MEDICARE OBSERVATION STATUS NOTIFICATION   Patient Details  Name: Paige Jenkins MRN: [id number] Date of Birth: 10/26/1951   Medicare Observation Status Notification Given:  Yes    Durenda GuthrieBrady, Darina Hartwell Naomi, RN 01/10/2018, 10:12 AM

## 2018-01-10 NOTE — Plan of Care (Signed)
  Nutrition: Adequate nutrition will be maintained 01/10/2018 1237 - Progressing by Darrow BussingArcilla, Rianna Lukes M, RN   Coping: Level of anxiety will decrease 01/10/2018 1237 - Progressing by Darrow BussingArcilla, Julien Berryman M, RN   Elimination: Will not experience complications related to bowel motility 01/10/2018 1237 - Progressing by Darrow BussingArcilla, Loyalty Arentz M, RN   Pain Managment: General experience of comfort will improve 01/10/2018 1237 - Progressing by Darrow BussingArcilla, Shameeka Silliman M, RN   Safety: Ability to remain free from injury will improve 01/10/2018 1237 - Progressing by Darrow BussingArcilla, Amyrie Illingworth M, RN

## 2018-01-10 NOTE — Progress Notes (Signed)
Patient verbally upset that she can't receive IM demerol and vistaril that was previously requested.Patient complains pain 10/10 ice pack intact to right knee along with knee immobilizer offered patient prn dose oxycodone .Will continue to monitor.

## 2018-01-10 NOTE — Care Management Note (Signed)
Case Management Note  Patient Details  Name: Paige Jenkins MRN: [id number] Date of Birth: 01/16/1951  Subjective/Objective:   67 yr old female s/p right total knee arthroplasty.                 Action/Plan: Case manager spke with patient concerning discharge plan and to get OBS letter signed. Patient says [facility] [place] Case manager will give this information to Child psychotherapistocial Worker. Patient says her family will assist her after rehab is completed.    Expected Discharge Date:   pending               Expected Discharge Plan:  IP Rehab Facility  In-House Referral:  Clinical Social Work  Discharge planning Services  CM Consult  Post Acute Care Choice:    Choice offered to:  NA  DME Arranged:  CPM DME Agency:  [employer]  HH Arranged:  NA HH Agency:  NA  Status of Service:  Completed, signed off  If discussed at [employer] Meetings, dates discussed:    Additional Comments:  Durenda GuthrieBrady, Talbot Monarch Naomi, RN 01/10/2018, 10:09 AM

## 2018-01-10 NOTE — Progress Notes (Signed)
PT Cancellation Note  Patient Details Name: Paige Jenkins MRN: [id number] DOB: 07/13/1951   Cancelled Treatment:    Reason Eval/Treat Not Completed: Patient declined, no reason specified Upon arrival, pt in bed and appeared comfortable with eyes closed. Pt not agreeable to participate in therapy due to 8/10 pain. Pt premedicated. Pt requested PT return at 2 pm. PT will attempt to return around 2 pm as schedule allows.    Derek MoundKellyn R Shanice Poznanski Renezmae Canlas, PTA Pager: [phone]   01/10/2018, 1:03 PM

## 2018-01-10 NOTE — Progress Notes (Signed)
Patient refuses to have foley catheter taken out this morning as ordered.Patient stated," I told my doctor that I wanted the catheter for at least three days .My doctor said it was okay for it to remain in." Patient orders are for foley catheter to be removed this morning but continues to refuse.Ortho tech in to ask patient if she wants to go into CPM machine this morning.Patient refusing states," I need discuss better pain control options with my doctor this morning."Patient has been receiving prn doses dilaudid,oxycodone,robaxin, and scheduled tylenol during this shift.

## 2018-01-10 NOTE — Progress Notes (Signed)
Physical Therapy Treatment Patient Details Name: Paige Jenkins MRN: [id number] DOB: 1951/01/06 Today's Date: 01/10/2018    History of Present Illness 67 yo admitted for R TKA. PMHx: HTN, depression, obesity    PT Comments    Patient agreeable to therapy and premedicated. Pt tolerated short distance gait this session and overall required min guard/min A for functional transfers, min A for ambulation with +2 for chair follow, and mod A for returning to supine in bed. Pt screaming out loudly with any attempts of R knee flexion. Continue to progress as tolerated with anticipated d/c to SNF for further skilled PT services.     Follow Up Recommendations  SNF;Supervision/Assistance - 24 hour     Equipment Recommendations  None recommended by PT    Recommendations for Other Services       Precautions / Restrictions Precautions Precautions: Fall;Knee Precaution Comments: knee precautions reviewed with pt Restrictions Weight Bearing Restrictions: Yes RLE Weight Bearing: Weight bearing as tolerated    Mobility  Bed Mobility Overal bed mobility: Needs Assistance Bed Mobility: Sit to Supine       Sit to supine: Mod assist   General bed mobility comments: assist to bring bilat LE into bed; max cues for sequencing and technique  Transfers Overall transfer level: Needs assistance Equipment used: Rolling walker (2 wheeled) Transfers: Sit to/from [employer] Sit to Stand: Min assist Stand pivot transfers: Min assist       General transfer comment: assist to power up into standing; less assist required for second trial from recliner; cues for safe hand placement  Ambulation/Gait Ambulation/Gait assistance: Min assist;+2 safety/equipment(chair follow) Ambulation Distance (Feet): 20 Feet Assistive device: Rolling walker (2 wheeled) Gait Pattern/deviations: Step-through pattern;Decreased stance time - right;Decreased step length - left;Decreased weight shift to  right Gait velocity: decreased   General Gait Details: cues for posture, sequencing, and R heel strike   Stairs            Wheelchair Mobility    Modified Rankin (Stroke Patients Only)       Balance Overall balance assessment: Needs assistance   Sitting balance-Leahy Scale: Good     Standing balance support: Bilateral upper extremity supported Standing balance-Leahy Scale: Poor                              Cognition Arousal/Alertness: Awake/alert Behavior During Therapy: WFL for tasks assessed/performed Overall Cognitive Status: No family/caregiver present to determine baseline cognitive functioning                                        Exercises      General Comments        Pertinent Vitals/Pain Pain Assessment: Faces Faces Pain Scale: Hurts whole lot Pain Location: R knee with flexion Pain Descriptors / Indicators: Guarding;Moaning Pain Intervention(s): Limited activity within patient's tolerance;Monitored during session;Premedicated before session;Repositioned    Home Living                      Prior Function            PT Goals (current goals can now be found in the care plan section) Progress towards PT goals: Progressing toward goals    Frequency    7X/week      PT Plan Current plan remains appropriate    Co-evaluation  AM-PAC PT "6 Clicks" Daily Activity  Outcome Measure  Difficulty turning over in bed (including adjusting bedclothes, sheets and blankets)?: A Lot Difficulty moving from lying on back to sitting on the side of the bed? : Unable Difficulty sitting down on and standing up from a chair with arms (e.g., wheelchair, bedside commode, etc,.)?: Unable Help needed moving to and from a bed to chair (including a wheelchair)?: Total Help needed walking in hospital room?: A Little Help needed climbing 3-5 steps with a railing? : A Lot 6 Click Score: 10    End of  Session Equipment Utilized During Treatment: Gait belt Activity Tolerance: Patient limited by pain Patient left: in bed;with call bell/phone within reach Nurse Communication: Mobility status PT Visit Diagnosis: Other abnormalities of gait and mobility (R26.89);Muscle weakness (generalized) (M62.81)     Time: [id number] PT Time Calculation (min) (ACUTE ONLY): 17 min  Charges:  $Gait Training: 8-22 mins                    G Codes:       Erline LevineKellyn Charley Miske, PTA Pager: [phone]     Carolynne EdouardKellyn R Tonnya Garbett 01/10/2018, 3:23 PM

## 2018-01-10 NOTE — Progress Notes (Signed)
Subjective: 1 Day Post-Op Procedure(s) (LRB): TOTAL KNEE ARTHROPLASTY (Right) Patient reports pain as 5 on 0-10 scale.   Patient has had a lengthy complaints with verbal abuse to the staff in all areas of care from preop, intraop, PACU, and floor nursing as well as calling me an asshole this morning.  I called Dr Madelon Lipsaffrey this am because the patient refused CPM, constantly demanded Demerol and Visteril, despite being told by all providers that this is against standard of care.  She refused foley removal and demanded it be left in for 3 days despite the infection risk explained to her by the nursing staff and me.  After a long conversation on speaker phone with Dr Madelon Lipsaffrey, patient has agreed to foley removal, CPM usage and participation in physical therapy.  Many thanks for his help in the matter on his day off.    Objective: Vital signs in last 24 hours: Temp:  [97 F (36.1 C)-99.2 F (37.3 C)] 99.2 F (37.3 C) (03/02 0421) Pulse Rate:  [60-80] 73 (03/02 0421) Resp:  [15-22] 16 (03/02 0421) BP: (110-178)/(70-90) 151/73 (03/02 0421) SpO2:  [90 %-100 %] 95 % (03/02 0421)  Intake/Output from previous day: 03/01 0701 - 03/02 0700 In: 4300 [P.O.:600; I.V.:3040; IV Piggyback:660] Out: 1000 [Urine:850; Blood:150] Intake/Output this shift: No intake/output data recorded.  Recent Labs    01/10/18 0556  HGB 12.6   Recent Labs    01/10/18 0556  WBC 10.9*  RBC 4.39  HCT 39.9  PLT 256   Recent Labs    01/10/18 0556  NA 135  K 4.1  CL 104  CO2 21*  BUN 7  CREATININE 0.64  GLUCOSE 129*  CALCIUM 8.3*   No results for input(s): LABPT, INR in the last 72 hours.  ABD soft Neurovascular intact Sensation intact distally Intact pulses distally Dorsiflexion/Plantar flexion intact Incision: dressing C/D/I  Assessment/Plan: 1 Day Post-Op Procedure(s) (LRB): TOTAL KNEE ARTHROPLASTY (Right) Advance diet Up with therapy D/C IV fluids  Offered patient Valium instead of Robaxin.   Patient prefers Robaxin.  Patient has made plans to go to skilled nursing care with discharge plan for Monday.  Today she ambulated to the chair with the assistance of a walker, significantly antalgic gait.  Will plan on continued PT for mobilization and OT for ADLs while in the hospital.  Julien GirtSHEPPERSON,Ugonna Keirsey J 01/10/2018, 8:19 AM

## 2018-01-10 NOTE — Progress Notes (Signed)
Orthopedic Tech Progress Note Patient Details:  Paige Jenkins 11/23/1950 [id number]  Patient ID: Paige Jenkins, female   DOB: 06/14/1951, 67 y.o.   MRN: [id number] Pt did not have cpm in room. I offered to get one for her so I could apply it. She stated that I could bring it into the room but she did not want to get on the machine until she spoke with the doctor. I delivered machine to the room.  Trinna PostMartinez, Carlea Badour J 01/10/2018, 6:13 AM

## 2018-01-10 NOTE — Progress Notes (Signed)
Pt premedicated prior to applying CPM at 16:40, Pt unable to tolerate 0-90 and was screaming in pain. Set to 0-70. Pain level from 7-9.

## 2018-01-10 NOTE — Care Management Obs Status (Signed)
MEDICARE OBSERVATION STATUS NOTIFICATION   Patient Details  Name: Paige Jenkins MRN: [id number] Date of Birth: 06/03/1951   Medicare Observation Status Notification Given:       Durenda GuthrieBrady, Anona Giovannini Naomi, RN 01/10/2018, 10:12 AM

## 2018-01-11 LAB — CBC
HEMATOCRIT: 40.6 % (ref 36.0–46.0)
HEMOGLOBIN: 13.5 g/dL (ref 12.0–15.0)
MCH: 30 pg (ref 26.0–34.0)
MCHC: 33.3 g/dL (ref 30.0–36.0)
MCV: 90.2 fL (ref 78.0–100.0)
Platelets: 256 10*3/uL (ref 150–400)
RBC: 4.5 MIL/uL (ref 3.87–5.11)
RDW: 14.3 % (ref 11.5–15.5)
WBC: 11.5 10*3/uL — ABNORMAL HIGH (ref 4.0–10.5)

## 2018-01-11 NOTE — Progress Notes (Signed)
Subjective: 2 Days Post-Op Procedure(s) (LRB): TOTAL KNEE ARTHROPLASTY (Right) Patient reports pain as 7 on 0-10 scale.   Patient complained last night about too many interruptions.  A sign was placed on the door requesting limited interactions with the patient so she could sleep.  Rounded on the patient at 8:30.  Had to wake patient up to interact.  Changed dressing and helped staff bathe patient.  When patient was standing she stated she need to go to the bathroom.  I encouraged the patient to walk to the bathroom.  She turned and walked to the chair and started cussing at me that she could not walk that far.  I offered a bedside commode and she continue to walk toward the chair cussing.  Once sitting the tech offered to take her to the rest room and she vehemently declined.   Objective: Vital signs in last 24 hours: Temp:  [98.2 F (36.8 C)-98.7 F (37.1 C)] 98.2 F (36.8 C) (03/03 0740) Pulse Rate:  [71-86] 73 (03/03 0740) Resp:  [16-18] 16 (03/03 0740) BP: (141-154)/(52-62) 141/52 (03/03 0740) SpO2:  [96 %-97 %] 97 % (03/03 0740)  Intake/Output from previous day: 03/02 0701 - 03/03 0700 In: -  Out: 180 [Urine:180] Intake/Output this shift: No intake/output data recorded.  Recent Labs    01/10/18 0556  HGB 12.6   Recent Labs    01/10/18 0556  WBC 10.9*  RBC 4.39  HCT 39.9  PLT 256   Recent Labs    01/10/18 0556  NA 135  K 4.1  CL 104  CO2 21*  BUN 7  CREATININE 0.64  GLUCOSE 129*  CALCIUM 8.3*   No results for input(s): LABPT, INR in the last 72 hours.  ABD soft Neurovascular intact Sensation intact distally Intact pulses distally Dorsiflexion/Plantar flexion intact Incision: dressing C/D/I and scant drainage  Assessment/Plan: 2 Days Post-Op Procedure(s) (LRB): TOTAL KNEE ARTHROPLASTY (Right)  Patient has been entirely inappropriate throughout her hospital stay.  She is scheduled to go to skilled facility tomorrow.  Hope her attitude and willingness to  participate will improve in her new setting.   Advance diet Up with therapy Plan for discharge tomorrow Discharge to SNF  [facility] Systems[facility]PPERSON,Paige Jenkins 01/11/2018, 8:46 AM

## 2018-01-11 NOTE — Progress Notes (Signed)
Pt premedicated, placed CPM at 15:15, set at 5-90. Pain level at 8.

## 2018-01-11 NOTE — Progress Notes (Signed)
Physical Therapy Treatment Patient Details Name: Paige Jenkins MRN: [id number] DOB: 07/04/1951 Today's Date: 01/11/2018    History of Present Illness 67 yo admitted for R TKA. PMHx: HTN, depression, obesity    PT Comments    Pt remains limited based on self limiting behavior and questionable anxiety.  Pt required education and ecouragement throughout session.  Pt sitting in copious amounts of urine on arrival.  Pt reports this happens every time she cough and when she stands.  Pt remains in need of rehab in a post acute setting based on her physical limitations.  ROM is also severly limited due to patient's poor pain tolerance.  Plan next session to progress mobility as patient is able to tolerate.    Follow Up Recommendations  SNF;Supervision/Assistance - 24 hour     Equipment Recommendations  None recommended by PT    Recommendations for Other Services       Precautions / Restrictions Precautions Precautions: Fall;Knee Precaution Comments: knee precautions reviewed with pt Restrictions Weight Bearing Restrictions: Yes RLE Weight Bearing: Weight bearing as tolerated    Mobility  Bed Mobility Overal bed mobility: Needs Assistance Bed Mobility: Sit to Supine       Sit to supine: Min guard   General bed mobility comments: Pt used gait belt to lift her RLE into bed against gravity.  Pt required cues for technique to improve ease but no physical assistance needed.    Transfers Overall transfer level: Needs assistance Equipment used: Rolling walker (2 wheeled) Transfers: Sit to/from Stand Sit to Stand: Min assist Stand pivot transfers: Min assist       General transfer comment: Pt required min assist to stabilize balance and boost into standing, increased time to achieve standing due to anxious presentation.  Pt remains unable to flex R knee to allow weight on RLE during transition to standing.  Pt with improved eccentric loading returning to seated position.     Ambulation/Gait Ambulation/Gait assistance: Min assist Ambulation Distance (Feet): 16 Feet Assistive device: Rolling walker (2 wheeled) Gait Pattern/deviations: Step-to pattern;Decreased stride length;Decreased stance time - right;Antalgic;Trunk flexed Gait velocity: decreased Gait velocity interpretation: Below normal speed for age/gender General Gait Details: Pt with popr safety awareness reaching for bed, propping elbows on her RW.  Difficult to address gt deviations due to poor safety with mobility.  Pt reports she feels like she is gonna take a nose dive, but once sitting does not appear dizzy or in distress.     Stairs            Wheelchair Mobility    Modified Rankin (Stroke Patients Only)       Balance Overall balance assessment: Needs assistance   Sitting balance-Leahy Scale: Good       Standing balance-Leahy Scale: Poor                              Cognition Arousal/Alertness: Awake/alert Behavior During Therapy: WFL for tasks assessed/performed;Anxious Overall Cognitive Status: No family/caregiver present to determine baseline cognitive functioning Area of Impairment: Problem solving;Safety/judgement                         Safety/Judgement: Decreased awareness of deficits   Problem Solving: Slow processing        Exercises Total Joint Exercises Ankle Circles/Pumps: AROM;Both;10 reps;Supine Quad Sets: AROM;Right;10 reps;Supine Heel Slides: AAROM;Right;10 reps;Supine(limited ROM) Hip ABduction/ADduction: AAROM;Right;10 reps;Supine Straight Leg Raises: AAROM;Right;10 reps;Supine  Knee Flexion: AROM;Right;10 reps;Seated(cues to keep her hips down on seat of chair as she attempts to rise to compensate.  ) Goniometric ROM: R knee ROM 56 degrees flexion in sitting.      General Comments        Pertinent Vitals/Pain Pain Assessment: 0-10 Pain Score: 8  Pain Location: R knee with flexion Pain Descriptors / Indicators:  Guarding;Moaning Pain Intervention(s): Monitored during session;Repositioned;Ice applied;Premedicated before session    Home Living                      Prior Function            PT Goals (current goals can now be found in the care plan section) Acute Rehab PT Goals Patient Stated Goal: be able to return to the pool Potential to Achieve Goals: Fair Progress towards PT goals: Progressing toward goals    Frequency    7X/week      PT Plan Current plan remains appropriate    Co-evaluation              AM-PAC PT "6 Clicks" Daily Activity  Outcome Measure  Difficulty turning over in bed (including adjusting bedclothes, sheets and blankets)?: A Lot Difficulty moving from lying on back to sitting on the side of the bed? : Unable Difficulty sitting down on and standing up from a chair with arms (e.g., wheelchair, bedside commode, etc,.)?: Unable Help needed moving to and from a bed to chair (including a wheelchair)?: A Little Help needed walking in hospital room?: A Little Help needed climbing 3-5 steps with a railing? : A Lot 6 Click Score: 12    End of Session Equipment Utilized During Treatment: Gait belt Activity Tolerance: Patient limited by pain Patient left: in bed;with call bell/phone within reach Nurse Communication: Mobility status PT Visit Diagnosis: Other abnormalities of gait and mobility (R26.89);Muscle weakness (generalized) (M62.81)     Time: 9604-5409 PT Time Calculation (min) (ACUTE ONLY): 33 min  Charges:  $Gait Training: 8-22 mins $Therapeutic Exercise: 8-22 mins                    G Codes:       Paige Jenkins, PTA pager [phone]    Paige Jenkins 01/11/2018, 11:58 AM

## 2018-01-11 NOTE — Plan of Care (Signed)
  Nutrition: Adequate nutrition will be maintained 01/11/2018 1101 - Progressing by Darrow BussingArcilla, Adrena Nakamura M, RN   Coping: Level of anxiety will decrease 01/11/2018 1101 - Progressing by Darrow BussingArcilla, Genae Strine M, RN   Elimination: Will not experience complications related to bowel motility 01/11/2018 1101 - Progressing by Darrow BussingArcilla, Sanii Kukla M, RN   Pain Managment: General experience of comfort will improve 01/11/2018 1101 - Progressing by Darrow BussingArcilla, Melvenia Favela M, RN   Safety: Ability to remain free from injury will improve 01/11/2018 1101 - Progressing by Darrow BussingArcilla, Ledford Goodson M, RN

## 2018-01-11 NOTE — Progress Notes (Signed)
Pt verbally abusive during dressing change and ambulating to chair this morning. Premedicated Pt prior to ambulating, refused to walk to the bathroom. Pt sitting on the chair, ice packs in place, legs elevated but refused bone foam. Pt agreed to be placed on CPM for 2 hours (5-90) after working with PT this afternoon.

## 2018-01-12 ENCOUNTER — Encounter (HOSPITAL_COMMUNITY): Payer: Self-pay | Admitting: Orthopedic Surgery

## 2018-01-12 LAB — CBC
HEMATOCRIT: 36.8 % (ref 36.0–46.0)
HEMOGLOBIN: 11.8 g/dL — AB (ref 12.0–15.0)
MCH: 29.1 pg (ref 26.0–34.0)
MCHC: 32.1 g/dL (ref 30.0–36.0)
MCV: 90.6 fL (ref 78.0–100.0)
Platelets: 265 10*3/uL (ref 150–400)
RBC: 4.06 MIL/uL (ref 3.87–5.11)
RDW: 14.3 % (ref 11.5–15.5)
WBC: 8.1 10*3/uL (ref 4.0–10.5)

## 2018-01-12 MED ORDER — DOCUSATE SODIUM 100 MG PO CAPS: 100.0000 mg | ORAL_CAPSULE | Freq: Two times a day (BID) | ORAL | 0 refills | Status: AC

## 2018-01-12 MED ORDER — ACETAMINOPHEN 325 MG PO TABS: 650.0000 mg | ORAL_TABLET | Freq: Four times a day (QID) | ORAL | 0 refills | Status: AC | PRN

## 2018-01-12 NOTE — Social Work (Signed)
CSW received call from [facility] admissions advising that they received auth for SNF placement.  CSW will f/u for disposition.  Keene BreathPatricia Beryl Balz, LCSW Clinical Social Worker [phone]

## 2018-01-12 NOTE — Social Work (Signed)
CSW went back in room to meet with patient at bedside, accompanied by charge nurse, to discuss transport. [facility] had [facility] the phone and CSW spoke with them and advised that non emergency transport can transport patient up to 50 miles and anything over 50 miles will need to be paid up front. SNF indicated that they will need priority authorization. CSW validated and indicated that patient will still need to pay up front and get [facility] or patient will need to get transport to SNF. CSW reiterated to patient and insurance company that patient is discharged today. [facility] representative will f/u with [facility] and contact CSW back to discuss.  CSW will continue to follow up as pt discharged.  Keene BreathPatricia Jaelie Aguilera, LCSW Clinical Social Worker [phone]

## 2018-01-12 NOTE — Clinical Social Work Placement (Signed)
   CLINICAL SOCIAL WORK PLACEMENT  NOTE  Date:  01/12/2018  Patient Details  Name: Paige Jenkins MRN: [id number] Date of Birth: 04/11/1951  Clinical Social Work is seeking post-discharge placement for this patient at the Skilled  Nursing Facility level of care (*CSW will initial, date and re-position this form in  chart as items are completed):  Yes   Patient/family provided with [facility] Clinical Social Work Department's list of facilities offering this level of care within the geographic area requested by the patient (or if unable, by the patient's family).  Yes   Patient/family informed of their freedom to choose among providers that offer the needed level of care, that participate in Medicare, Medicaid or managed care program needed by the patient, have an available bed and are willing to accept the patient.  Yes   Patient/family informed of [facility]'s ownership interest in [facility] and [facility], as well as of the fact that they are under no obligation to receive care at these facilities.  PASRR submitted to EDS on       PASRR number received on 01/12/18     Existing PASRR number confirmed on       FL2 transmitted to all facilities in geographic area requested by pt/family on 01/12/18     FL2 transmitted to all facilities within larger geographic area on       Patient informed that his/her managed care company has contracts with or will negotiate with certain facilities, including the following:        Yes   Patient/family informed of bed offers received.  Patient chooses bed at [facility], [address], [place], [place] [address])     Physician recommends and patient chooses bed at      Patient to be transferred to [facility], [address], [place], [place] [address]) on  .  Patient to be transferred to facility by [facility]     Patient family notified on 01/12/18 of transfer.  Name of family member notified:  pt responsible for self     PHYSICIAN    Additional Comment:    _______________________________________________ Tresa MoorePatricia V German Manke, LCSW 01/12/2018, 12:56 PM
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
## 2018-01-12 NOTE — Clinical Social Work Note (Signed)
Clinical Social Work Assessment  Patient Details  Name: Paige Jenkins MRN: [id number] Date of Birth: 08/15/1951  Date of referral:  01/12/18               Reason for consult:  Facility Placement                Permission sought to share information with:  Case Manager Permission granted to share information::  Yes, Verbal Permission Granted  Name::     CytogeneticistMeaghan Branton  Agency::  [facility]   Relationship::  daughter  Contact Information:     Housing/Transportation Living arrangements for the past 2 months:  Single Family Home Source of Information:  Patient Patient Interpreter Needed:  None Criminal Activity/Legal Involvement Pertinent to Current Situation/Hospitalization:  No - Comment as needed Significant Relationships:  Adult Children, Other Family Members Lives with:  Adult Children Do you feel safe going back to the place where you live?  No Need for family participation in patient care:  No (Coment)  Care giving concerns:  Pt has new impairment and clinical staff recommending SNF. Pt unable to be cared for at home with new impairment and agreeable to SNF. Pt has pre-arranged [facility] [place]. CSW explained SNF process and placement. Pt agreeable and gave permission for CSW to send to [facility]. CSW explained the Insurance Auth process and that SNF will need to obtain. Pt voiced understanding. CSW will assist with transport as well.   Social Worker assessment / plan:  CSW will f/u for disposition.  Employment status:  Disabled (Comment on whether or not currently receiving Disability) Insurance information:  Managed Medicare PT Recommendations:  Skilled Nursing Facility Information / Referral to community resources:  Skilled Nursing Facility  Patient/Family's Response to care:  Pt appreciative of  CSW assistance with SNF process. Pt has already prearranged with SNF.  Patient/Family's Understanding of and Emotional Response  to Diagnosis, Current Treatment, and Prognosis:  Patient has good understanding of impairment and has prearranged for SNF placement. Pt agreeable to SNF and willing to go for short term prior to returning home with daughter. CSW will assist with disposition as insurance auth needed. No issues or concerns at this time.   Emotional Assessment Appearance:  Appears stated age Attitude/Demeanor/Rapport:  (Cooperative) Affect (typically observed):  Accepting, Appropriate Orientation:  Oriented to Self, Oriented to Place, Oriented to  Time, Oriented to Situation Alcohol / Substance use:  Not Applicable Psych involvement (Current and /or in the community):  No (Comment)  Discharge Needs  Concerns to be addressed:  Discharge Planning Concerns Readmission within the last 30 days:  No Current discharge risk:  Physical Impairment, Dependent with Mobility Barriers to Discharge:  No Barriers Identified   Tresa Mooreatricia V Zyanne Schumm, LCSW 01/12/2018, 11:19 AM

## 2018-01-12 NOTE — Social Work (Addendum)
CSW f/u to get authorization for transport to SNF as [employer] indicated that patient does not need prior authorization for transport. CSW then called [facility] and and was advised by Mateo Flow that in order for patient to not have to pay up front, they will need prior auth as they are out of network. CSW f/u as there are no other transports, non emergency ambulance available. CSW called [facility] and [facility] and unable to reach a live person. CSW contacted [facility] again and will have to f/u as they were experiencing some technical issues. Patient unable to afford the $941.76 transport to SNF as transport bills insurance up to 50 miles and for anything over that mileage they would need to pay up front or have prior authorization. CSW has been working on same for many hours today and still have no resolution.  CSW discussed case at length with clinical staff at Dr. Alvester Morin office as Claiborne Billings spoke with [facility] and discussed transportation barriers.  CSW will f/u again with [facility].  4:26pm: CSW called [employer] and provided them ICD 10 code. [employer]HC indicated that they needed a CPT code. CSW contacted Claiborne Billings at Dr. French Ana to see if she can assist if possible.  CSW unable to get auth for transport to SNF today and will continue to follow up.  4:35pm: CSW received call back from Belt at [facility] office and she provided CPT code [phone].  CSW then contacted [facility] again to obtain prior auth for transport tp SNF. CSW was advised by rep that they are having a sytem update and will not have access to all of the information and requested CSW call back in about 30 minutes.    4:45: CSW met with patient at bedside to discuss barriers and status. Pt indicated she was trying to get an uber. CSW discussed the limitations with that form of transport given her knee.  Pt was on the phone with [facility] and they provided another phone number for CSW to call to initiate Insurance Auth as CSW advised them of the difficulties.   5:00pm CSW  then called [employer] at [phone] and initiated Insurance Auth for transport to SNF. OEC#[id number].  CSW will f/u tomorrow on status of Insurance Auth for transport.  CSW then called tori at SNF and provided an update. SNF is ready for patient.  CSW will f/u tomorrow.  Elissa Hefty, LCSW Clinical Social Worker [phone]

## 2018-01-12 NOTE — Social Work (Signed)
CSW received call from Sardishristy at [facility] that they have received clinicals and [facility].  CSW will continue to follow.  Keene BreathPatricia Asuncion Tapscott, LCSW Clinical Social Worker [phone]

## 2018-01-12 NOTE — Social Work (Signed)
CSW contacted SNF-[facility], [place], [place]) [phone], to discuss SNF bed. Per Delorise Jacksonori, patient has prearranged.  CSW inquired of Insurance Auth and they will f/u.CSW sent clinicals for placement.  SNF will need to obtain Insurance Auth for placement.  CSW will f/u as patient discharging today.  Keene BreathPatricia Trinitie Mcgirr, LCSW Clinical Social Worker [phone]

## 2018-01-12 NOTE — Plan of Care (Signed)
  Nutrition: Adequate nutrition will be maintained 01/12/2018 0953 - Progressing by Darrow BussingArcilla, Hiliana Eilts M, RN   Coping: Level of anxiety will decrease 01/12/2018 0953 - Progressing by Darrow BussingArcilla, Giovannina Mun M, RN   Pain Managment: General experience of comfort will improve 01/12/2018 0953 - Progressing by Darrow BussingArcilla, Delphin Funes M, RN   Safety: Ability to remain free from injury will improve 01/12/2018 0953 - Progressing by Darrow BussingArcilla, Cecilie Heidel M, RN

## 2018-01-12 NOTE — Progress Notes (Signed)
Subjective: 3 Days Post-Op Procedure(s) (LRB): TOTAL KNEE ARTHROPLASTY (Right) Patient reports pain as moderate.  Patient pleasant this morning discussed d/c plans to SNF and some d/c instructions.  Objective: Vital signs in last 24 hours: Temp:  [98.7 F (37.1 C)-99.7 F (37.6 C)] 98.9 F (37.2 C) (03/04 0617) Pulse Rate:  [76-85] 82 (03/04 0617) Resp:  [16] 16 (03/04 0617) BP: (116-155)/(45-71) 155/71 (03/04 0617) SpO2:  [96 %-97 %] 96 % (03/04 0617)  Intake/Output from previous day: 03/03 0701 - 03/04 0700 In: -  Out: 400 [Urine:400] Intake/Output this shift: No intake/output data recorded.  Recent Labs    01/10/18 0556 01/11/18 1048 01/12/18 0658  HGB 12.6 13.5 11.8*   Recent Labs    01/11/18 1048 01/12/18 0658  WBC 11.5* 8.1  RBC 4.50 4.06  HCT 40.6 36.8  PLT 256 265   Recent Labs    01/10/18 0556  NA 135  K 4.1  CL 104  CO2 21*  BUN 7  CREATININE 0.64  GLUCOSE 129*  CALCIUM 8.3*   No results for input(s): LABPT, INR in the last 72 hours.  Neurovascular intact Sensation intact distally Intact pulses distally Dorsiflexion/Plantar flexion intact Incision: dressing C/D/I  Assessment/Plan: 3 Days Post-Op Procedure(s) (LRB): TOTAL KNEE ARTHROPLASTY (Right) Up with therapy Discharge to SNF  [facility] Yehonatan Jenkins 01/12/2018, 8:52 AM

## 2018-01-12 NOTE — Progress Notes (Addendum)
Physical Therapy Treatment Patient Details Name: Paige CreeLouann L Hollingshead MRN: [id number] DOB: 06/28/1951 Today's Date: 01/12/2018    History of Present Illness 67 yo admitted for R TKA. PMHx: HTN, depression, obesity    PT Comments    [facility]. Therapist, therapy tech, and NT entered room at the same time. Pt initially not agreeable to PT, reported she had not been premedicated (although pt was premedicated), and became belligerent and cussing.  Pt was assisted off of BSC and then upon standing agreeable to participate in therapy however was upset throughout session and continued to complain about not having enough pain medication. Pt requires assistance for safe mobility. Pt was made as comfortable as possible in bed end of session with ice packs on L knee. Current plan remains appropriate.    Follow Up Recommendations  SNF;Supervision/Assistance - 24 hour     Equipment Recommendations  None recommended by PT    Recommendations for Other Services       Precautions / Restrictions Precautions Precautions: Fall;Knee Restrictions Weight Bearing Restrictions: Yes RLE Weight Bearing: Weight bearing as tolerated    Mobility  Bed Mobility Overal bed mobility: Needs Assistance Bed Mobility: Sit to Supine       Sit to supine: Min assist   General bed mobility comments: assist to bring L LE into bed; cues for sequencing and technique; pt able to bring L LE most of the way into bed with use of blanket as leg lifter  Transfers Overall transfer level: Needs assistance Equipment used: Rolling walker (2 wheeled) Transfers: Sit to/from Stand Sit to Stand: Min assist         General transfer comment: assist to power up into standing; cues for safe hand placement from [facility]; pt on BSC upon arrival; pt initially not agreeable to PT session and became beligerent then after standing [facility] reported wanting to try ambulating  Ambulation/Gait Ambulation/Gait assistance:  Min assist;+2 safety/equipment(chair follow) Ambulation Distance (Feet): 40 Feet Assistive device: Rolling walker (2 wheeled) Gait Pattern/deviations: Step-to pattern;Decreased stride length;Decreased stance time - right;Antalgic;Trunk flexed;Decreased step length - left Gait velocity: decreased   General Gait Details: cues for posture, breathing technique, and safe use of AD   Stairs            Wheelchair Mobility    Modified Rankin (Stroke Patients Only)       Balance Overall balance assessment: Needs assistance   Sitting balance-Leahy Scale: Good     Standing balance support: Bilateral upper extremity supported Standing balance-Leahy Scale: Poor                              Cognition Arousal/Alertness: Awake/alert Behavior During Therapy: WFL for tasks assessed/performed;Agitated Overall Cognitive Status: No family/caregiver present to determine baseline cognitive functioning                                 General Comments: pt continues to be rude to staff memebers and cussing at therapist during session; pt upset and reported she had not had pain medicine prior to PT arrival however pt was premedicated and therapist educated pt on when she is able to receive more pain medicine      Exercises      General Comments General comments (skin integrity, edema, etc.): pt with 3 bed rails up end of session adn then requested 4th rail  up so she doesn't "fall out of the bed"      Pertinent Vitals/Pain Pain Assessment: Faces Faces Pain Scale: Hurts little more Pain Location: R knee with flexion Pain Descriptors / Indicators: Guarding;Moaning;Other (Comment)(screaming at times ) Pain Intervention(s): Limited activity within patient's tolerance;Monitored during session;Premedicated before session;Repositioned;Ice applied    Home Living                      Prior Function            PT Goals (current goals can now be found in the  care plan section) Acute Rehab PT Goals PT Goal Formulation: With patient Time For Goal Achievement: 01/23/18 Potential to Achieve Goals: Fair Progress towards PT goals: Progressing toward goals    Frequency    7X/week      PT Plan Current plan remains appropriate    Co-evaluation              AM-PAC PT "6 Clicks" Daily Activity  Outcome Measure  Difficulty turning over in bed (including adjusting bedclothes, sheets and blankets)?: A Lot Difficulty moving from lying on back to sitting on the side of the bed? : Unable Difficulty sitting down on and standing up from a chair with arms (e.g., wheelchair, bedside commode, etc,.)?: Unable Help needed moving to and from a bed to chair (including a wheelchair)?: A Little Help needed walking in hospital room?: A Little Help needed climbing 3-5 steps with a railing? : A Lot 6 Click Score: 12    End of Session Equipment Utilized During Treatment: Gait belt Activity Tolerance: Treatment limited secondary to agitation Patient left: in bed;with call bell/phone within reach Nurse Communication: Mobility status PT Visit Diagnosis: Other abnormalities of gait and mobility (R26.89);Muscle weakness (generalized) (M62.81)     Time: 2536-6440 PT Time Calculation (min) (ACUTE ONLY): 23 min  Charges:  $Gait Training: 8-22 mins $Therapeutic Activity: 8-22 mins                    G Codes:       Erline Levine, PTA Pager: [phone]     Carolynne Edouard 01/12/2018, 4:00 PM

## 2018-01-12 NOTE — [place] FL2 (Signed)
[place] MEDICAID FL2 LEVEL OF CARE SCREENING TOOL     IDENTIFICATION  Patient Name: Paige Jenkins Birthdate: 08/12/1951 Sex: female Admission Date (Current Location): 01/09/2018  [place]  [facility]   Facility and Address:  [facility] [place], [address], [place], [place] [address]      Provider Number: [id number]  Attending Physician Name and Address:  Frederico Hammanaffrey, Daniel, MD  Relative Name and Phone Number:  Tito DineMeaghan Zenz, daughter, [phone]    Current Level of Care: Hospital Recommended Level of Care: Skilled Nursing Facility Prior Approval Number:    Date Approved/Denied:   PASRR Number: [id number] [id number]  Discharge Plan: SNF    Current Diagnoses: Patient Active Problem List   Diagnosis Date Noted  . Primary localized osteoarthritis of right knee 01/09/2018  . Sore throat 04/15/2017  . Onychomycosis 03/12/2017  . HLD (hyperlipidemia) 03/12/2017  . Cough 10/31/2016  . Hand numbness 05/30/2016  . Headache 01/25/2016  . Syncope 10/20/2015  . Stress incontinence 11/01/2014  . Contusion of unspecified site 06/10/2014  . Trochanteric bursitis of right hip 10/20/2013  . Right knee pain 10/20/2013  . Ovarian cyst 05/20/2012  . Left ankle pain 04/23/2012  . Hypertension 04/23/2012  . Depression 04/23/2012  . Preventative health care 04/23/2012    Orientation RESPIRATION BLADDER Height & Weight     Self, Situation, Time, Place  Normal Continent Weight: 279 lb (126.6 kg) Height:     BEHAVIORAL SYMPTOMS/MOOD NEUROLOGICAL BOWEL NUTRITION STATUS      Continent Diet(See DC Summary)  AMBULATORY STATUS COMMUNICATION OF NEEDS Skin   Extensive Assist Verbally Surgical wounds                       Personal Care Assistance Level of Assistance  Bathing, Feeding, Dressing Bathing Assistance: Limited assistance Feeding assistance: Limited assistance Dressing Assistance: Limited assistance     Functional Limitations  Info  Speech, Hearing, Sight Sight Info: Adequate Hearing Info: Adequate Speech Info: Adequate    SPECIAL CARE FACTORS FREQUENCY  PT (By licensed PT), OT (By licensed OT)     PT Frequency: 5x week OT Frequency: 5x week            Contractures      Additional Factors Info  Code Status, Allergies, Psychotropic Code Status Info: Full Code Allergies Info: IODINE, IVP DYE IODINATED DIAGNOSTIC AGENTS, EGGS OR EGG-DERIVED PRODUCTS  Psychotropic Info: Celexa   Isolation Precautions Info: N/A     Current Medications (01/12/2018):  This is the current hospital active medication list Current Facility-Administered Medications  Medication Dose Route Frequency Provider Last Rate Last Dose  . 0.9 %  sodium chloride infusion   Intravenous Continuous Chadwell, Joshua, PA-C 100 mL/hr at 01/09/18 1206    . apixaban (ELIQUIS) tablet 2.5 mg  2.5 mg Oral Q12H Chadwell, Joshua, PA-C   2.5 mg at 01/12/18 0808  . citalopram (CELEXA) tablet 60 mg  60 mg Oral Daily Chadwell, Joshua, PA-C   60 mg at 01/12/18 [id number]  . diphenhydrAMINE (BENADRYL) 12.5 MG/5ML elixir 12.5-25 mg  12.5-25 mg Oral Q4H PRN Chadwell, Joshua, PA-C      . docusate sodium (COLACE) capsule 100 mg  100 mg Oral BID Chadwell, Joshua, PA-C   100 mg at 01/12/18 0808  . HYDROmorphone (DILAUDID) injection 0.5-1 mg  0.5-1 mg Intravenous Q4H PRN Chadwell, Joshua, PA-C   0.5 mg at 01/12/18 0739  . menthol-cetylpyridinium (CEPACOL) lozenge 3 mg  1 lozenge Oral  PRN Chadwell, Ivin Booty, PA-C       Or  . phenol (CHLORASEPTIC) mouth spray 1 spray  1 spray Mouth/Throat PRN Chadwell, Joshua, PA-C      . methocarbamol (ROBAXIN) tablet 500 mg  500 mg Oral Q6H PRN Chadwell, Joshua, PA-C   500 mg at 01/12/18 1100   Or  . methocarbamol (ROBAXIN) 500 mg in dextrose 5 % 50 mL IVPB  500 mg Intravenous Q6H PRN Chadwell, Joshua, PA-C      . metoCLOPramide (REGLAN) tablet 5-10 mg  5-10 mg Oral Q8H PRN Chadwell, Joshua, PA-C       Or  . metoCLOPramide (REGLAN)  injection 5-10 mg  5-10 mg Intravenous Q8H PRN Chadwell, Joshua, PA-C      . metoprolol succinate (TOPROL-XL) 24 hr tablet 25 mg  25 mg Oral Daily Chadwell, Joshua, PA-C   25 mg at 01/12/18 0808  . ondansetron (ZOFRAN) tablet 4 mg  4 mg Oral Q6H PRN Chadwell, Joshua, PA-C       Or  . ondansetron (ZOFRAN) injection 4 mg  4 mg Intravenous Q6H PRN Chadwell, Joshua, PA-C      . oxyCODONE (Oxy IR/ROXICODONE) immediate release tablet 5-10 mg  5-10 mg Oral Q4H PRN Chadwell, Joshua, PA-C   10 mg at 01/12/18 1100  . senna-docusate (Senokot-S) tablet 1 tablet  1 tablet Oral QHS PRN Chadwell, Joshua, PA-C      . sodium phosphate (FLEET) 7-19 GM/118ML enema 1 enema  1 enema Rectal Once PRN Chadwell, Joshua, PA-C      . sorbitol 70 % solution 30 mL  30 mL Oral Daily PRN Margart Sickles, PA-C         Discharge Medications: Please see discharge summary for a list of discharge medications.  Relevant Imaging Results:  Relevant Lab Results:   Additional Information SS#: [id number]  Tresa Moore, LCSW

## 2018-01-12 NOTE — Social Work (Signed)
CSW contacted [facility] to discuss transport to SNF. [facility] advised that patient would need to pay up front for transport to SNF since it is over 50 miles and it will cost $941.76 to transport. CSW discussed with patient who was very upset about having to pay transport and indicated that CSW need to find other ways to transport her to SNF. CSW encouraged patient to call her insurance company to see what transport company she can use to get to SNF.   CSW will f/u for assistance.  Keene BreathPatricia Iniko Robles, LCSW Clinical Social Worker [phone]

## 2018-01-13 ENCOUNTER — Telehealth: Payer: Self-pay | Admitting: Family Medicine

## 2018-01-13 DIAGNOSIS — H3589 Other specified retinal disorders: Secondary | ICD-10-CM | POA: Diagnosis not present

## 2018-01-13 DIAGNOSIS — M1711 Unilateral primary osteoarthritis, right knee: Secondary | ICD-10-CM | POA: Diagnosis not present

## 2018-01-13 DIAGNOSIS — M25561 Pain in right knee: Secondary | ICD-10-CM | POA: Diagnosis not present

## 2018-01-13 DIAGNOSIS — H40023 Open angle with borderline findings, high risk, bilateral: Secondary | ICD-10-CM | POA: Diagnosis not present

## 2018-01-13 DIAGNOSIS — Z471 Aftercare following joint replacement surgery: Secondary | ICD-10-CM | POA: Diagnosis not present

## 2018-01-13 DIAGNOSIS — R1313 Dysphagia, pharyngeal phase: Secondary | ICD-10-CM | POA: Diagnosis not present

## 2018-01-13 DIAGNOSIS — Z96651 Presence of right artificial knee joint: Secondary | ICD-10-CM | POA: Diagnosis not present

## 2018-01-13 DIAGNOSIS — K5900 Constipation, unspecified: Secondary | ICD-10-CM | POA: Diagnosis not present

## 2018-01-13 DIAGNOSIS — R5381 Other malaise: Secondary | ICD-10-CM | POA: Diagnosis not present

## 2018-01-13 DIAGNOSIS — Z7901 Long term (current) use of anticoagulants: Secondary | ICD-10-CM | POA: Diagnosis not present

## 2018-01-13 DIAGNOSIS — I10 Essential (primary) hypertension: Secondary | ICD-10-CM | POA: Diagnosis not present

## 2018-01-13 NOTE — Social Work (Signed)
Clinical Social Worker facilitated patient discharge including contacting patient family and facility to confirm patient discharge plans.  Clinical information faxed to facility and family agreeable with plan.    CSW arranged wheelchair [facility], [address], [place], [place] [address] at 2:00pm.  RN to call [phone] to give report prior to discharge.  Clinical Social Worker will sign off for now as social work intervention is no longer needed. Please consult us again if new need arises.  Keene BreathPatricia Deshannon Seide, LCSW Clinical Social Worker [phone]

## 2018-01-13 NOTE — Clinical Social Work Placement (Signed)
   CLINICAL SOCIAL WORK PLACEMENT  NOTE  Date:  01/13/2018  Patient Details  Name: Paige Jenkins MRN: [id number] Date of Birth: 10/12/1951  Clinical Social Work is seeking post-discharge placement for this patient at the Skilled  Nursing Facility level of care (*CSW will initial, date and re-position this form in  chart as items are completed):  Yes   Patient/family provided with [facility] Clinical Social Work Department's list of facilities offering this level of care within the geographic area requested by the patient (or if unable, by the patient's family).  Yes   Patient/family informed of their freedom to choose among providers that offer the needed level of care, that participate in Medicare, Medicaid or managed care program needed by the patient, have an available bed and are willing to accept the patient.  Yes   Patient/family informed of [facility]'s ownership interest in [facility] and [facility], as well as of the fact that they are under no obligation to receive care at these facilities.  PASRR submitted to EDS on       PASRR number received on 01/12/18     Existing PASRR number confirmed on       FL2 transmitted to all facilities in geographic area requested by pt/family on 01/12/18     FL2 transmitted to all facilities within larger geographic area on       Patient informed that his/her managed care company has contracts with or will negotiate with certain facilities, including the following:        Yes   Patient/family informed of bed offers received.  Patient [facility] at [facility], [address], [place], [place] [address])     Physician recommends and patient chooses bed at      Patient to be transferred to [facility], [address], [place], [place] [address]) on  .  Patient to be transferred to facility by [facility]     Patient family notified on 01/13/18 of transfer.  Name of family member notified:  pt responsible for self     PHYSICIAN    Additional Comment:    _______________________________________________ Tresa MoorePatricia V Selenne Coggin, LCSW 01/13/2018, 12:37 PM

## 2018-01-13 NOTE — Progress Notes (Signed)
1500  [facility] called for report.  Will call Katie back.

## 2018-01-13 NOTE — Social Work (Addendum)
CSW met with patient at bedside and discussed disposition. CSW advised patient that [facility] has not provided auth for transport. CSW preceded to explain transport process again and if she does not have auth today then she will have to pay for ambulance or obtain family member to transport.  CSW reiterated that she can receive a bill as there appears to be no medical necessity for her to remain in hospital. Pt adamant that she does not have transport. CSW advised that she will be unable to stay in hospital pending transport as she was discharged yesterday. Pt stated that she will call [place]. CSW indicated that prior auth was started yesterday for transport and CSW will f/u.  CSW received call from [facility] indicating that EUMP#[id number] has a cpt code provided for procedure and not a CPT code for transport. CSW advised that we would not have a CPT code for Transport and [employer] did not provide that. [employer] recommended CSW call back and re-initiate auth again. CSW declined to do so as this process was started yesterday and [employer] should have indicated and provided a CPT code for transport as CSW obtained CPT code from [place] office. CSW requested to speak to a Freight forwarder. CSW was placed on hold and then advised that a manger will call her back to address.  11:00am: CSW discussed with Clini Dir. Hope R. She suggested that patient try a wheelchair Lucianne Lei self-pay, family or possible uber as her knee does not warrant medical necessity for ambulance and [employer] will probably not approve. Pt is unable to stay in hospital and would need to be discharged and responsible for same.   CSW called [employer] and spoke to [place] and she indicated that the cost to transport to [place] is $300. She would take payment over the phone.  CSW confirmed that they can transport today and would need to know as soon as possible.   11:10am: CSW met with patient at bedside who was on the phone with [facility] customer service rep. CSW  explained transportation barriers. CSW advised that patient wheelchair Lucianne Lei can transport patient to SNF for $300.  CSW advised patient and [employer] that transport would need payment and reservation by noon to transport patient today.  Pt attempted to tell [employer] that she cannot be transported in a vehicle. CSW interjected and advised that patient can transport in vehicle safely. Pt not pleased that CSW sharing this information. CSW reiterated that patient can safely be transported in a vehicle.  CSW will f/u again and provided patient a time of noon today to have made her decision as CSW may need to get additional administrative resources involved to help transition discharge patient.  !2:05pm: CSW met with patient again to discuss the disposition and see if patient has settled on the wheelchair van at self pay or having a family member pick her up. Pt was on hold with [facility] and they were unable to assist. Pt then discussed with daughter who was unable to transport her. Pt agreed to pay the $300 for [facility] to get [facility]. CSW then provided patient contact information for [employer] so that she can pay bill over the phone and transport can be set.  12:20pm: CSW contacted [employer] and confirmed transport for 2:00pm to [place] at 2:00pm. CSW advised nurse of same. CSW then contacted Tori at [facility] and sent updated discharge summary.  Elissa Hefty, LCSW Clinical Social Worker [phone]

## 2018-01-13 NOTE — Progress Notes (Signed)
1556- [facility] [phone].

## 2018-01-13 NOTE — Telephone Encounter (Signed)
Unfortunately she was not on our service.  Therefore we're not able to do anything in regards to keeping her in the hospital longer.  I can see her after she is seen at the rehab center.  She was discharged home from the hospital today.

## 2018-01-13 NOTE — Progress Notes (Signed)
Attempted to call report to receiving nurse @ [facility]  [phone].  Nurse to receive report is with another patient.  Left name and contact number of this writer for nurse to return call for report to be given on patient.

## 2018-01-13 NOTE — Telephone Encounter (Signed)
[facility] called on behalf of this patient. Pt had knee surgery on Friday. Pt needs transportation to get from [place] to [facility]. [facility] needs authorization to be able to send her from hospital to [facility] which is 60 + miles apart.   Elease Hashimotoatricia, Child psychotherapistsocial worker at hospital was very nasty to patient while in hospital and would not really work with her or give authorization.  Elease Hashimotoatricia told patient she only has 21 days left to be able to leave for skilled nursing facilities, which isnt true, only has 7 days left. If doesn't get a prior authorization she will have to go home with her daughter after those 7 days.   She would also like to see if it's possible to get prior auth to get pt to stay at hospital for a few more days as well.  Please contact Orlando PennerEruanadae Collins with [facility] with any questions/concerns- when you call, ask someone to find her on jabber and send her a message with your number and she will return your call.

## 2018-01-13 NOTE — Progress Notes (Signed)
Patient d/ced via w/c to Paige Jenkins awaiting to take [facility] to [facility].  Patient received Robaxin 500mg  po prior to d/c.  Resp evan and nonlabored. D/ced instructions reviewed with patient and all questions answered.  Patient signed d/c instructions.  All of patients belongings sent with her. No distress noted from patient prior to discharge.

## 2018-01-14 DIAGNOSIS — R5381 Other malaise: Secondary | ICD-10-CM | POA: Diagnosis not present

## 2018-01-14 DIAGNOSIS — I10 Essential (primary) hypertension: Secondary | ICD-10-CM | POA: Diagnosis not present

## 2018-01-14 DIAGNOSIS — M25561 Pain in right knee: Secondary | ICD-10-CM | POA: Diagnosis not present

## 2018-01-16 NOTE — Telephone Encounter (Signed)
I spoke with Paige Jenkins, Child psychotherapistocial Worker at [facility], and she stated that said she did not speak with pt at all about the prior authorization in the hospital. She helped patient with arranging the transportation once out of the hospital and finding a way to pay for it. She also mentioned she did not discuss anything with patient about staying longer in the hospital.  [facility] spoke with patient to get the information below and the pt quoted that "Paige Hashimotoatricia the Child psychotherapistsocial worker was nasty to the patient while in the hospital and would not really work with her or give authorization. Paige Hashimotoatricia told patient she only has 21 days left to be able to leave for skilled nursing facilities, which isn't true (for [facility]), pt only only has 7 days left. If she doesn't get prior authorization she will have to go home with her daughter after those 7 days." That's what the pt relayed to Paige Jenkins at [facility].

## 2018-01-21 DIAGNOSIS — R5381 Other malaise: Secondary | ICD-10-CM | POA: Diagnosis not present

## 2018-01-21 DIAGNOSIS — M25561 Pain in right knee: Secondary | ICD-10-CM | POA: Diagnosis not present

## 2018-01-21 DIAGNOSIS — I10 Essential (primary) hypertension: Secondary | ICD-10-CM | POA: Diagnosis not present

## 2018-01-22 DIAGNOSIS — H40023 Open angle with borderline findings, high risk, bilateral: Secondary | ICD-10-CM | POA: Diagnosis not present

## 2018-01-22 DIAGNOSIS — H3589 Other specified retinal disorders: Secondary | ICD-10-CM | POA: Diagnosis not present

## 2018-01-22 DIAGNOSIS — M1711 Unilateral primary osteoarthritis, right knee: Secondary | ICD-10-CM | POA: Diagnosis not present

## 2018-01-28 DIAGNOSIS — I10 Essential (primary) hypertension: Secondary | ICD-10-CM | POA: Diagnosis not present

## 2018-01-28 DIAGNOSIS — M25561 Pain in right knee: Secondary | ICD-10-CM | POA: Diagnosis not present

## 2018-01-28 DIAGNOSIS — R5381 Other malaise: Secondary | ICD-10-CM | POA: Diagnosis not present

## 2018-02-04 DIAGNOSIS — R2689 Other abnormalities of gait and mobility: Secondary | ICD-10-CM | POA: Diagnosis not present

## 2018-02-04 DIAGNOSIS — E785 Hyperlipidemia, unspecified: Secondary | ICD-10-CM | POA: Diagnosis not present

## 2018-02-04 DIAGNOSIS — I10 Essential (primary) hypertension: Secondary | ICD-10-CM | POA: Diagnosis not present

## 2018-02-04 DIAGNOSIS — Z7901 Long term (current) use of anticoagulants: Secondary | ICD-10-CM | POA: Diagnosis not present

## 2018-02-04 DIAGNOSIS — R1313 Dysphagia, pharyngeal phase: Secondary | ICD-10-CM | POA: Diagnosis not present

## 2018-02-04 DIAGNOSIS — Z96651 Presence of right artificial knee joint: Secondary | ICD-10-CM | POA: Diagnosis not present

## 2018-02-04 DIAGNOSIS — M25561 Pain in right knee: Secondary | ICD-10-CM | POA: Diagnosis not present

## 2018-02-04 DIAGNOSIS — Z471 Aftercare following joint replacement surgery: Secondary | ICD-10-CM | POA: Diagnosis not present

## 2018-02-05 DIAGNOSIS — Z471 Aftercare following joint replacement surgery: Secondary | ICD-10-CM | POA: Diagnosis not present

## 2018-02-05 DIAGNOSIS — Z7901 Long term (current) use of anticoagulants: Secondary | ICD-10-CM | POA: Diagnosis not present

## 2018-02-05 DIAGNOSIS — E785 Hyperlipidemia, unspecified: Secondary | ICD-10-CM | POA: Diagnosis not present

## 2018-02-05 DIAGNOSIS — R1313 Dysphagia, pharyngeal phase: Secondary | ICD-10-CM | POA: Diagnosis not present

## 2018-02-05 DIAGNOSIS — M25561 Pain in right knee: Secondary | ICD-10-CM | POA: Diagnosis not present

## 2018-02-05 DIAGNOSIS — R2689 Other abnormalities of gait and mobility: Secondary | ICD-10-CM | POA: Diagnosis not present

## 2018-02-05 DIAGNOSIS — Z96651 Presence of right artificial knee joint: Secondary | ICD-10-CM | POA: Diagnosis not present

## 2018-02-05 DIAGNOSIS — I10 Essential (primary) hypertension: Secondary | ICD-10-CM | POA: Diagnosis not present

## 2018-02-06 DIAGNOSIS — R1313 Dysphagia, pharyngeal phase: Secondary | ICD-10-CM | POA: Diagnosis not present

## 2018-02-06 DIAGNOSIS — I10 Essential (primary) hypertension: Secondary | ICD-10-CM | POA: Diagnosis not present

## 2018-02-06 DIAGNOSIS — R2689 Other abnormalities of gait and mobility: Secondary | ICD-10-CM | POA: Diagnosis not present

## 2018-02-06 DIAGNOSIS — Z7901 Long term (current) use of anticoagulants: Secondary | ICD-10-CM | POA: Diagnosis not present

## 2018-02-06 DIAGNOSIS — Z96651 Presence of right artificial knee joint: Secondary | ICD-10-CM | POA: Diagnosis not present

## 2018-02-06 DIAGNOSIS — E785 Hyperlipidemia, unspecified: Secondary | ICD-10-CM | POA: Diagnosis not present

## 2018-02-06 DIAGNOSIS — M25561 Pain in right knee: Secondary | ICD-10-CM | POA: Diagnosis not present

## 2018-02-06 DIAGNOSIS — Z471 Aftercare following joint replacement surgery: Secondary | ICD-10-CM | POA: Diagnosis not present

## 2018-02-09 DIAGNOSIS — R2689 Other abnormalities of gait and mobility: Secondary | ICD-10-CM | POA: Diagnosis not present

## 2018-02-09 DIAGNOSIS — R1313 Dysphagia, pharyngeal phase: Secondary | ICD-10-CM | POA: Diagnosis not present

## 2018-02-09 DIAGNOSIS — Z7901 Long term (current) use of anticoagulants: Secondary | ICD-10-CM | POA: Diagnosis not present

## 2018-02-09 DIAGNOSIS — I10 Essential (primary) hypertension: Secondary | ICD-10-CM | POA: Diagnosis not present

## 2018-02-09 DIAGNOSIS — E785 Hyperlipidemia, unspecified: Secondary | ICD-10-CM | POA: Diagnosis not present

## 2018-02-09 DIAGNOSIS — Z471 Aftercare following joint replacement surgery: Secondary | ICD-10-CM | POA: Diagnosis not present

## 2018-02-09 DIAGNOSIS — Z96651 Presence of right artificial knee joint: Secondary | ICD-10-CM | POA: Diagnosis not present

## 2018-02-09 DIAGNOSIS — M25561 Pain in right knee: Secondary | ICD-10-CM | POA: Diagnosis not present

## 2018-02-11 DIAGNOSIS — Z7901 Long term (current) use of anticoagulants: Secondary | ICD-10-CM | POA: Diagnosis not present

## 2018-02-11 DIAGNOSIS — M25561 Pain in right knee: Secondary | ICD-10-CM | POA: Diagnosis not present

## 2018-02-11 DIAGNOSIS — R1313 Dysphagia, pharyngeal phase: Secondary | ICD-10-CM | POA: Diagnosis not present

## 2018-02-11 DIAGNOSIS — I10 Essential (primary) hypertension: Secondary | ICD-10-CM | POA: Diagnosis not present

## 2018-02-11 DIAGNOSIS — Z96651 Presence of right artificial knee joint: Secondary | ICD-10-CM | POA: Diagnosis not present

## 2018-02-11 DIAGNOSIS — R2689 Other abnormalities of gait and mobility: Secondary | ICD-10-CM | POA: Diagnosis not present

## 2018-02-11 DIAGNOSIS — Z471 Aftercare following joint replacement surgery: Secondary | ICD-10-CM | POA: Diagnosis not present

## 2018-02-11 DIAGNOSIS — E785 Hyperlipidemia, unspecified: Secondary | ICD-10-CM | POA: Diagnosis not present

## 2018-02-13 DIAGNOSIS — I10 Essential (primary) hypertension: Secondary | ICD-10-CM | POA: Diagnosis not present

## 2018-02-13 DIAGNOSIS — Z7901 Long term (current) use of anticoagulants: Secondary | ICD-10-CM | POA: Diagnosis not present

## 2018-02-13 DIAGNOSIS — R1313 Dysphagia, pharyngeal phase: Secondary | ICD-10-CM | POA: Diagnosis not present

## 2018-02-13 DIAGNOSIS — Z96651 Presence of right artificial knee joint: Secondary | ICD-10-CM | POA: Diagnosis not present

## 2018-02-13 DIAGNOSIS — Z471 Aftercare following joint replacement surgery: Secondary | ICD-10-CM | POA: Diagnosis not present

## 2018-02-13 DIAGNOSIS — M25561 Pain in right knee: Secondary | ICD-10-CM | POA: Diagnosis not present

## 2018-02-13 DIAGNOSIS — E785 Hyperlipidemia, unspecified: Secondary | ICD-10-CM | POA: Diagnosis not present

## 2018-02-13 DIAGNOSIS — R2689 Other abnormalities of gait and mobility: Secondary | ICD-10-CM | POA: Diagnosis not present

## 2018-02-16 DIAGNOSIS — M1711 Unilateral primary osteoarthritis, right knee: Secondary | ICD-10-CM | POA: Diagnosis not present

## 2018-02-16 DIAGNOSIS — Z471 Aftercare following joint replacement surgery: Secondary | ICD-10-CM | POA: Diagnosis not present

## 2018-02-16 DIAGNOSIS — R2689 Other abnormalities of gait and mobility: Secondary | ICD-10-CM | POA: Diagnosis not present

## 2018-02-16 DIAGNOSIS — M25561 Pain in right knee: Secondary | ICD-10-CM | POA: Diagnosis not present

## 2018-02-16 DIAGNOSIS — I10 Essential (primary) hypertension: Secondary | ICD-10-CM | POA: Diagnosis not present

## 2018-02-16 DIAGNOSIS — E785 Hyperlipidemia, unspecified: Secondary | ICD-10-CM | POA: Diagnosis not present

## 2018-02-16 DIAGNOSIS — R1313 Dysphagia, pharyngeal phase: Secondary | ICD-10-CM | POA: Diagnosis not present

## 2018-02-16 DIAGNOSIS — Z96651 Presence of right artificial knee joint: Secondary | ICD-10-CM | POA: Diagnosis not present

## 2018-02-16 DIAGNOSIS — Z7901 Long term (current) use of anticoagulants: Secondary | ICD-10-CM | POA: Diagnosis not present

## 2018-02-18 DIAGNOSIS — Z471 Aftercare following joint replacement surgery: Secondary | ICD-10-CM | POA: Diagnosis not present

## 2018-02-18 DIAGNOSIS — Z96651 Presence of right artificial knee joint: Secondary | ICD-10-CM | POA: Diagnosis not present

## 2018-02-18 DIAGNOSIS — Z7901 Long term (current) use of anticoagulants: Secondary | ICD-10-CM | POA: Diagnosis not present

## 2018-02-18 DIAGNOSIS — M25561 Pain in right knee: Secondary | ICD-10-CM | POA: Diagnosis not present

## 2018-02-18 DIAGNOSIS — R1313 Dysphagia, pharyngeal phase: Secondary | ICD-10-CM | POA: Diagnosis not present

## 2018-02-18 DIAGNOSIS — I10 Essential (primary) hypertension: Secondary | ICD-10-CM | POA: Diagnosis not present

## 2018-02-18 DIAGNOSIS — R2689 Other abnormalities of gait and mobility: Secondary | ICD-10-CM | POA: Diagnosis not present

## 2018-02-18 DIAGNOSIS — E785 Hyperlipidemia, unspecified: Secondary | ICD-10-CM | POA: Diagnosis not present

## 2018-02-20 DIAGNOSIS — M25561 Pain in right knee: Secondary | ICD-10-CM | POA: Diagnosis not present

## 2018-02-20 DIAGNOSIS — I10 Essential (primary) hypertension: Secondary | ICD-10-CM | POA: Diagnosis not present

## 2018-02-20 DIAGNOSIS — R1313 Dysphagia, pharyngeal phase: Secondary | ICD-10-CM | POA: Diagnosis not present

## 2018-02-20 DIAGNOSIS — Z7901 Long term (current) use of anticoagulants: Secondary | ICD-10-CM | POA: Diagnosis not present

## 2018-02-20 DIAGNOSIS — Z471 Aftercare following joint replacement surgery: Secondary | ICD-10-CM | POA: Diagnosis not present

## 2018-02-20 DIAGNOSIS — E785 Hyperlipidemia, unspecified: Secondary | ICD-10-CM | POA: Diagnosis not present

## 2018-02-20 DIAGNOSIS — Z96651 Presence of right artificial knee joint: Secondary | ICD-10-CM | POA: Diagnosis not present

## 2018-02-20 DIAGNOSIS — R2689 Other abnormalities of gait and mobility: Secondary | ICD-10-CM | POA: Diagnosis not present

## 2018-02-23 DIAGNOSIS — Z471 Aftercare following joint replacement surgery: Secondary | ICD-10-CM | POA: Diagnosis not present

## 2018-02-23 DIAGNOSIS — R1313 Dysphagia, pharyngeal phase: Secondary | ICD-10-CM | POA: Diagnosis not present

## 2018-02-23 DIAGNOSIS — R2689 Other abnormalities of gait and mobility: Secondary | ICD-10-CM | POA: Diagnosis not present

## 2018-02-23 DIAGNOSIS — Z96651 Presence of right artificial knee joint: Secondary | ICD-10-CM | POA: Diagnosis not present

## 2018-02-23 DIAGNOSIS — E785 Hyperlipidemia, unspecified: Secondary | ICD-10-CM | POA: Diagnosis not present

## 2018-02-23 DIAGNOSIS — I10 Essential (primary) hypertension: Secondary | ICD-10-CM | POA: Diagnosis not present

## 2018-02-23 DIAGNOSIS — Z7901 Long term (current) use of anticoagulants: Secondary | ICD-10-CM | POA: Diagnosis not present

## 2018-02-23 DIAGNOSIS — M25561 Pain in right knee: Secondary | ICD-10-CM | POA: Diagnosis not present

## 2018-02-25 DIAGNOSIS — I10 Essential (primary) hypertension: Secondary | ICD-10-CM | POA: Diagnosis not present

## 2018-02-25 DIAGNOSIS — R2689 Other abnormalities of gait and mobility: Secondary | ICD-10-CM | POA: Diagnosis not present

## 2018-02-25 DIAGNOSIS — Z471 Aftercare following joint replacement surgery: Secondary | ICD-10-CM | POA: Diagnosis not present

## 2018-02-25 DIAGNOSIS — Z7901 Long term (current) use of anticoagulants: Secondary | ICD-10-CM | POA: Diagnosis not present

## 2018-02-25 DIAGNOSIS — R1313 Dysphagia, pharyngeal phase: Secondary | ICD-10-CM | POA: Diagnosis not present

## 2018-02-25 DIAGNOSIS — E785 Hyperlipidemia, unspecified: Secondary | ICD-10-CM | POA: Diagnosis not present

## 2018-02-25 DIAGNOSIS — Z96651 Presence of right artificial knee joint: Secondary | ICD-10-CM | POA: Diagnosis not present

## 2018-02-25 DIAGNOSIS — M25561 Pain in right knee: Secondary | ICD-10-CM | POA: Diagnosis not present

## 2018-02-27 DIAGNOSIS — Z471 Aftercare following joint replacement surgery: Secondary | ICD-10-CM | POA: Diagnosis not present

## 2018-02-27 DIAGNOSIS — Z96651 Presence of right artificial knee joint: Secondary | ICD-10-CM | POA: Diagnosis not present

## 2018-02-27 DIAGNOSIS — M25561 Pain in right knee: Secondary | ICD-10-CM | POA: Diagnosis not present

## 2018-02-27 DIAGNOSIS — E785 Hyperlipidemia, unspecified: Secondary | ICD-10-CM | POA: Diagnosis not present

## 2018-02-27 DIAGNOSIS — R2689 Other abnormalities of gait and mobility: Secondary | ICD-10-CM | POA: Diagnosis not present

## 2018-02-27 DIAGNOSIS — I10 Essential (primary) hypertension: Secondary | ICD-10-CM | POA: Diagnosis not present

## 2018-02-27 DIAGNOSIS — Z7901 Long term (current) use of anticoagulants: Secondary | ICD-10-CM | POA: Diagnosis not present

## 2018-02-27 DIAGNOSIS — R1313 Dysphagia, pharyngeal phase: Secondary | ICD-10-CM | POA: Diagnosis not present

## 2018-03-02 DIAGNOSIS — R1313 Dysphagia, pharyngeal phase: Secondary | ICD-10-CM | POA: Diagnosis not present

## 2018-03-02 DIAGNOSIS — Z471 Aftercare following joint replacement surgery: Secondary | ICD-10-CM | POA: Diagnosis not present

## 2018-03-02 DIAGNOSIS — Z7901 Long term (current) use of anticoagulants: Secondary | ICD-10-CM | POA: Diagnosis not present

## 2018-03-02 DIAGNOSIS — I10 Essential (primary) hypertension: Secondary | ICD-10-CM | POA: Diagnosis not present

## 2018-03-02 DIAGNOSIS — M25561 Pain in right knee: Secondary | ICD-10-CM | POA: Diagnosis not present

## 2018-03-02 DIAGNOSIS — E785 Hyperlipidemia, unspecified: Secondary | ICD-10-CM | POA: Diagnosis not present

## 2018-03-02 DIAGNOSIS — Z96651 Presence of right artificial knee joint: Secondary | ICD-10-CM | POA: Diagnosis not present

## 2018-03-02 DIAGNOSIS — R2689 Other abnormalities of gait and mobility: Secondary | ICD-10-CM | POA: Diagnosis not present

## 2018-03-04 DIAGNOSIS — Z471 Aftercare following joint replacement surgery: Secondary | ICD-10-CM | POA: Diagnosis not present

## 2018-03-04 DIAGNOSIS — R2689 Other abnormalities of gait and mobility: Secondary | ICD-10-CM | POA: Diagnosis not present

## 2018-03-04 DIAGNOSIS — E785 Hyperlipidemia, unspecified: Secondary | ICD-10-CM | POA: Diagnosis not present

## 2018-03-04 DIAGNOSIS — Z96651 Presence of right artificial knee joint: Secondary | ICD-10-CM | POA: Diagnosis not present

## 2018-03-04 DIAGNOSIS — I10 Essential (primary) hypertension: Secondary | ICD-10-CM | POA: Diagnosis not present

## 2018-03-04 DIAGNOSIS — M25561 Pain in right knee: Secondary | ICD-10-CM | POA: Diagnosis not present

## 2018-03-04 DIAGNOSIS — R1313 Dysphagia, pharyngeal phase: Secondary | ICD-10-CM | POA: Diagnosis not present

## 2018-03-04 DIAGNOSIS — Z7901 Long term (current) use of anticoagulants: Secondary | ICD-10-CM | POA: Diagnosis not present

## 2018-03-09 DIAGNOSIS — R1313 Dysphagia, pharyngeal phase: Secondary | ICD-10-CM | POA: Diagnosis not present

## 2018-03-09 DIAGNOSIS — R2689 Other abnormalities of gait and mobility: Secondary | ICD-10-CM | POA: Diagnosis not present

## 2018-03-09 DIAGNOSIS — M1711 Unilateral primary osteoarthritis, right knee: Secondary | ICD-10-CM | POA: Diagnosis not present

## 2018-03-09 DIAGNOSIS — Z7901 Long term (current) use of anticoagulants: Secondary | ICD-10-CM | POA: Diagnosis not present

## 2018-03-09 DIAGNOSIS — Z471 Aftercare following joint replacement surgery: Secondary | ICD-10-CM | POA: Diagnosis not present

## 2018-03-09 DIAGNOSIS — E785 Hyperlipidemia, unspecified: Secondary | ICD-10-CM | POA: Diagnosis not present

## 2018-03-09 DIAGNOSIS — M25561 Pain in right knee: Secondary | ICD-10-CM | POA: Diagnosis not present

## 2018-03-09 DIAGNOSIS — Z96651 Presence of right artificial knee joint: Secondary | ICD-10-CM | POA: Diagnosis not present

## 2018-03-09 DIAGNOSIS — I10 Essential (primary) hypertension: Secondary | ICD-10-CM | POA: Diagnosis not present

## 2018-03-11 DIAGNOSIS — E785 Hyperlipidemia, unspecified: Secondary | ICD-10-CM | POA: Diagnosis not present

## 2018-03-11 DIAGNOSIS — M25561 Pain in right knee: Secondary | ICD-10-CM | POA: Diagnosis not present

## 2018-03-11 DIAGNOSIS — Z471 Aftercare following joint replacement surgery: Secondary | ICD-10-CM | POA: Diagnosis not present

## 2018-03-11 DIAGNOSIS — Z7901 Long term (current) use of anticoagulants: Secondary | ICD-10-CM | POA: Diagnosis not present

## 2018-03-11 DIAGNOSIS — R1313 Dysphagia, pharyngeal phase: Secondary | ICD-10-CM | POA: Diagnosis not present

## 2018-03-11 DIAGNOSIS — R2689 Other abnormalities of gait and mobility: Secondary | ICD-10-CM | POA: Diagnosis not present

## 2018-03-11 DIAGNOSIS — Z96651 Presence of right artificial knee joint: Secondary | ICD-10-CM | POA: Diagnosis not present

## 2018-03-11 DIAGNOSIS — I10 Essential (primary) hypertension: Secondary | ICD-10-CM | POA: Diagnosis not present

## 2018-03-16 DIAGNOSIS — M25571 Pain in right ankle and joints of right foot: Secondary | ICD-10-CM | POA: Diagnosis not present

## 2018-03-16 DIAGNOSIS — M1711 Unilateral primary osteoarthritis, right knee: Secondary | ICD-10-CM | POA: Diagnosis not present

## 2018-03-18 DIAGNOSIS — Z471 Aftercare following joint replacement surgery: Secondary | ICD-10-CM | POA: Diagnosis not present

## 2018-03-18 DIAGNOSIS — Z7901 Long term (current) use of anticoagulants: Secondary | ICD-10-CM | POA: Diagnosis not present

## 2018-03-18 DIAGNOSIS — M25561 Pain in right knee: Secondary | ICD-10-CM | POA: Diagnosis not present

## 2018-03-18 DIAGNOSIS — E785 Hyperlipidemia, unspecified: Secondary | ICD-10-CM | POA: Diagnosis not present

## 2018-03-18 DIAGNOSIS — R1313 Dysphagia, pharyngeal phase: Secondary | ICD-10-CM | POA: Diagnosis not present

## 2018-03-18 DIAGNOSIS — R2689 Other abnormalities of gait and mobility: Secondary | ICD-10-CM | POA: Diagnosis not present

## 2018-03-18 DIAGNOSIS — I10 Essential (primary) hypertension: Secondary | ICD-10-CM | POA: Diagnosis not present

## 2018-03-18 DIAGNOSIS — Z96651 Presence of right artificial knee joint: Secondary | ICD-10-CM | POA: Diagnosis not present

## 2018-03-20 DIAGNOSIS — Z7901 Long term (current) use of anticoagulants: Secondary | ICD-10-CM | POA: Diagnosis not present

## 2018-03-20 DIAGNOSIS — Z471 Aftercare following joint replacement surgery: Secondary | ICD-10-CM | POA: Diagnosis not present

## 2018-03-20 DIAGNOSIS — R1313 Dysphagia, pharyngeal phase: Secondary | ICD-10-CM | POA: Diagnosis not present

## 2018-03-20 DIAGNOSIS — R2689 Other abnormalities of gait and mobility: Secondary | ICD-10-CM | POA: Diagnosis not present

## 2018-03-20 DIAGNOSIS — M25561 Pain in right knee: Secondary | ICD-10-CM | POA: Diagnosis not present

## 2018-03-20 DIAGNOSIS — E785 Hyperlipidemia, unspecified: Secondary | ICD-10-CM | POA: Diagnosis not present

## 2018-03-20 DIAGNOSIS — Z96651 Presence of right artificial knee joint: Secondary | ICD-10-CM | POA: Diagnosis not present

## 2018-03-20 DIAGNOSIS — I10 Essential (primary) hypertension: Secondary | ICD-10-CM | POA: Diagnosis not present

## 2018-03-30 DIAGNOSIS — M25561 Pain in right knee: Secondary | ICD-10-CM | POA: Diagnosis not present

## 2018-04-09 ENCOUNTER — Telehealth: Payer: Self-pay

## 2018-04-09 NOTE — Telephone Encounter (Addendum)
Patient left message on nurse line that she has had a recent knee replacement. She is [facility] and was told she needs an antibiotic to take an hour before her appointment.  She also has a question related to some disability paperwork for PCP.  Please send to [facility] in [place], [place].  Call back is [phone]  Ples Specter, RN [facility] RN)

## 2018-04-13 NOTE — Telephone Encounter (Signed)
Called and had to leave message.  No reason for abx prior to dental procedure.

## 2018-05-12 DIAGNOSIS — M25561 Pain in right knee: Secondary | ICD-10-CM | POA: Diagnosis not present

## 2018-05-15 DIAGNOSIS — M25561 Pain in right knee: Secondary | ICD-10-CM | POA: Diagnosis not present

## 2018-05-21 DIAGNOSIS — M1711 Unilateral primary osteoarthritis, right knee: Secondary | ICD-10-CM | POA: Diagnosis not present

## 2018-05-26 DIAGNOSIS — M25561 Pain in right knee: Secondary | ICD-10-CM | POA: Diagnosis not present

## 2018-05-29 DIAGNOSIS — M25561 Pain in right knee: Secondary | ICD-10-CM | POA: Diagnosis not present

## 2018-06-16 DIAGNOSIS — M25561 Pain in right knee: Secondary | ICD-10-CM | POA: Diagnosis not present

## 2018-06-24 DIAGNOSIS — I517 Cardiomegaly: Secondary | ICD-10-CM | POA: Diagnosis not present

## 2018-06-24 DIAGNOSIS — R05 Cough: Secondary | ICD-10-CM | POA: Diagnosis not present

## 2018-06-24 DIAGNOSIS — J0140 Acute pansinusitis, unspecified: Secondary | ICD-10-CM | POA: Diagnosis not present

## 2018-06-24 DIAGNOSIS — R0602 Shortness of breath: Secondary | ICD-10-CM | POA: Diagnosis not present

## 2018-06-24 DIAGNOSIS — H60333 Swimmer's ear, bilateral: Secondary | ICD-10-CM | POA: Diagnosis not present

## 2018-06-24 DIAGNOSIS — J208 Acute bronchitis due to other specified organisms: Secondary | ICD-10-CM | POA: Diagnosis not present

## 2018-06-30 DIAGNOSIS — M25561 Pain in right knee: Secondary | ICD-10-CM | POA: Diagnosis not present

## 2018-07-03 DIAGNOSIS — M25561 Pain in right knee: Secondary | ICD-10-CM | POA: Diagnosis not present

## 2018-07-07 DIAGNOSIS — H1033 Unspecified acute conjunctivitis, bilateral: Secondary | ICD-10-CM | POA: Diagnosis not present

## 2018-07-14 DIAGNOSIS — M25561 Pain in right knee: Secondary | ICD-10-CM | POA: Diagnosis not present

## 2018-07-23 DIAGNOSIS — M25561 Pain in right knee: Secondary | ICD-10-CM | POA: Diagnosis not present

## 2018-07-28 DIAGNOSIS — M25561 Pain in right knee: Secondary | ICD-10-CM | POA: Diagnosis not present

## 2018-07-30 DIAGNOSIS — M25561 Pain in right knee: Secondary | ICD-10-CM | POA: Diagnosis not present

## 2018-08-28 DIAGNOSIS — M25561 Pain in right knee: Secondary | ICD-10-CM | POA: Diagnosis not present

## 2018-10-12 DIAGNOSIS — M25561 Pain in right knee: Secondary | ICD-10-CM | POA: Diagnosis not present

## 2018-10-12 DIAGNOSIS — M25562 Pain in left knee: Secondary | ICD-10-CM | POA: Diagnosis not present

## 2018-10-12 DIAGNOSIS — M1712 Unilateral primary osteoarthritis, left knee: Secondary | ICD-10-CM | POA: Diagnosis not present

## 2018-10-12 DIAGNOSIS — M1611 Unilateral primary osteoarthritis, right hip: Secondary | ICD-10-CM | POA: Diagnosis not present

## 2018-10-29 DIAGNOSIS — M1611 Unilateral primary osteoarthritis, right hip: Secondary | ICD-10-CM | POA: Diagnosis not present

## 2018-11-16 DIAGNOSIS — M1611 Unilateral primary osteoarthritis, right hip: Secondary | ICD-10-CM | POA: Diagnosis not present

## 2018-11-16 DIAGNOSIS — M1712 Unilateral primary osteoarthritis, left knee: Secondary | ICD-10-CM | POA: Diagnosis not present

## 2018-12-17 DIAGNOSIS — M25551 Pain in right hip: Secondary | ICD-10-CM | POA: Diagnosis not present

## 2018-12-17 DIAGNOSIS — M25562 Pain in left knee: Secondary | ICD-10-CM | POA: Diagnosis not present

## 2018-12-17 DIAGNOSIS — I10 Essential (primary) hypertension: Secondary | ICD-10-CM | POA: Diagnosis not present

## 2019-03-18 DIAGNOSIS — M1611 Unilateral primary osteoarthritis, right hip: Secondary | ICD-10-CM | POA: Diagnosis not present

## 2019-05-03 DIAGNOSIS — M1611 Unilateral primary osteoarthritis, right hip: Secondary | ICD-10-CM | POA: Diagnosis not present

## 2019-05-20 DIAGNOSIS — M1611 Unilateral primary osteoarthritis, right hip: Secondary | ICD-10-CM | POA: Diagnosis not present

## 2019-05-28 DIAGNOSIS — E785 Hyperlipidemia, unspecified: Secondary | ICD-10-CM | POA: Diagnosis not present

## 2019-05-28 DIAGNOSIS — Z01818 Encounter for other preprocedural examination: Secondary | ICD-10-CM | POA: Diagnosis not present

## 2019-05-28 DIAGNOSIS — M1611 Unilateral primary osteoarthritis, right hip: Secondary | ICD-10-CM | POA: Diagnosis not present

## 2019-05-28 DIAGNOSIS — B351 Tinea unguium: Secondary | ICD-10-CM | POA: Diagnosis not present

## 2019-05-28 DIAGNOSIS — I10 Essential (primary) hypertension: Secondary | ICD-10-CM | POA: Diagnosis not present

## 2019-10-14 ENCOUNTER — Other Ambulatory Visit: Payer: Self-pay

## 2019-10-14 DIAGNOSIS — Z1231 Encounter for screening mammogram for malignant neoplasm of breast: Secondary | ICD-10-CM

## 2019-12-14 IMAGING — CR DG CHEST 2V
2 series · 2 of 2 positions shown · non-contrast
Comparison: None.

CLINICAL DATA: Right knee osteoarthritis. Pre-op respiratory exam
for total knee arthroplasty.

EXAM:
CHEST  2 VIEW

[w chest pa]
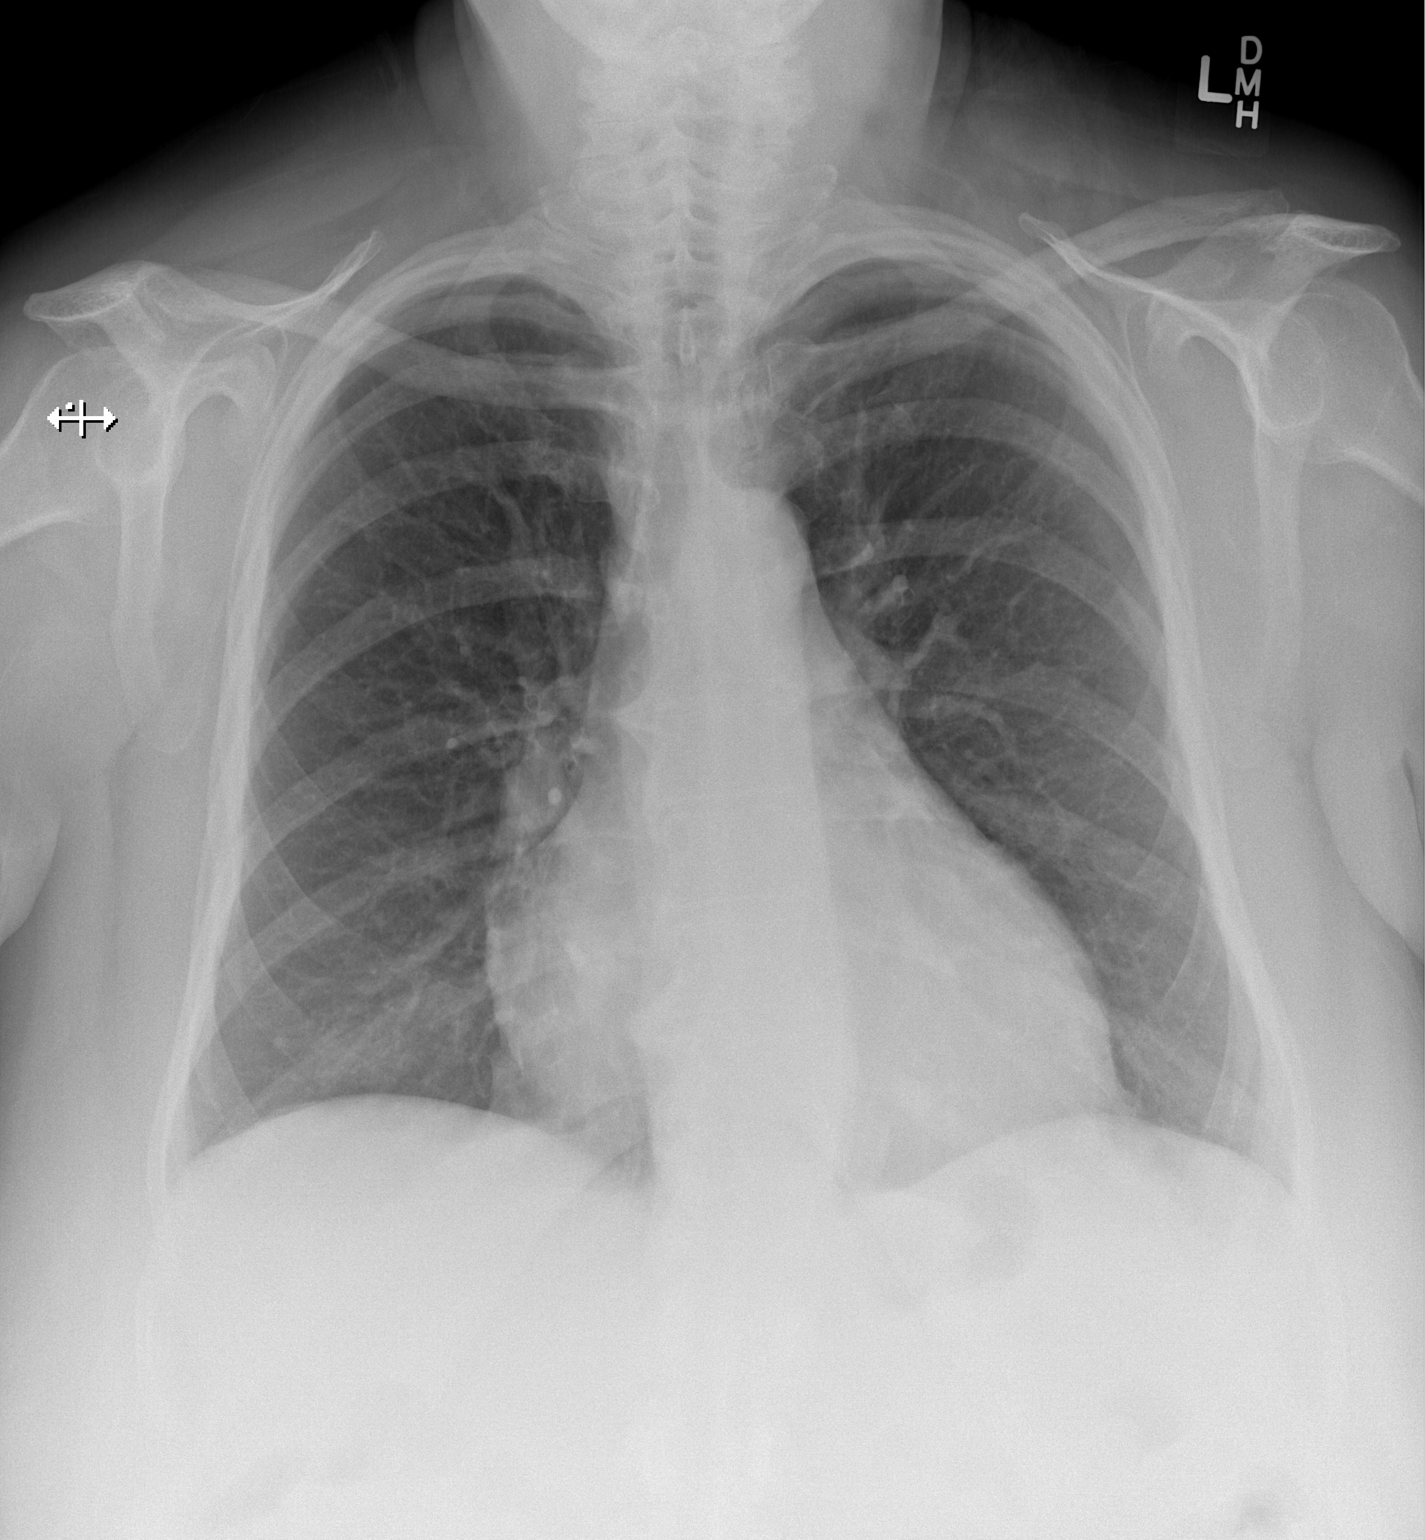

[w chest lat]
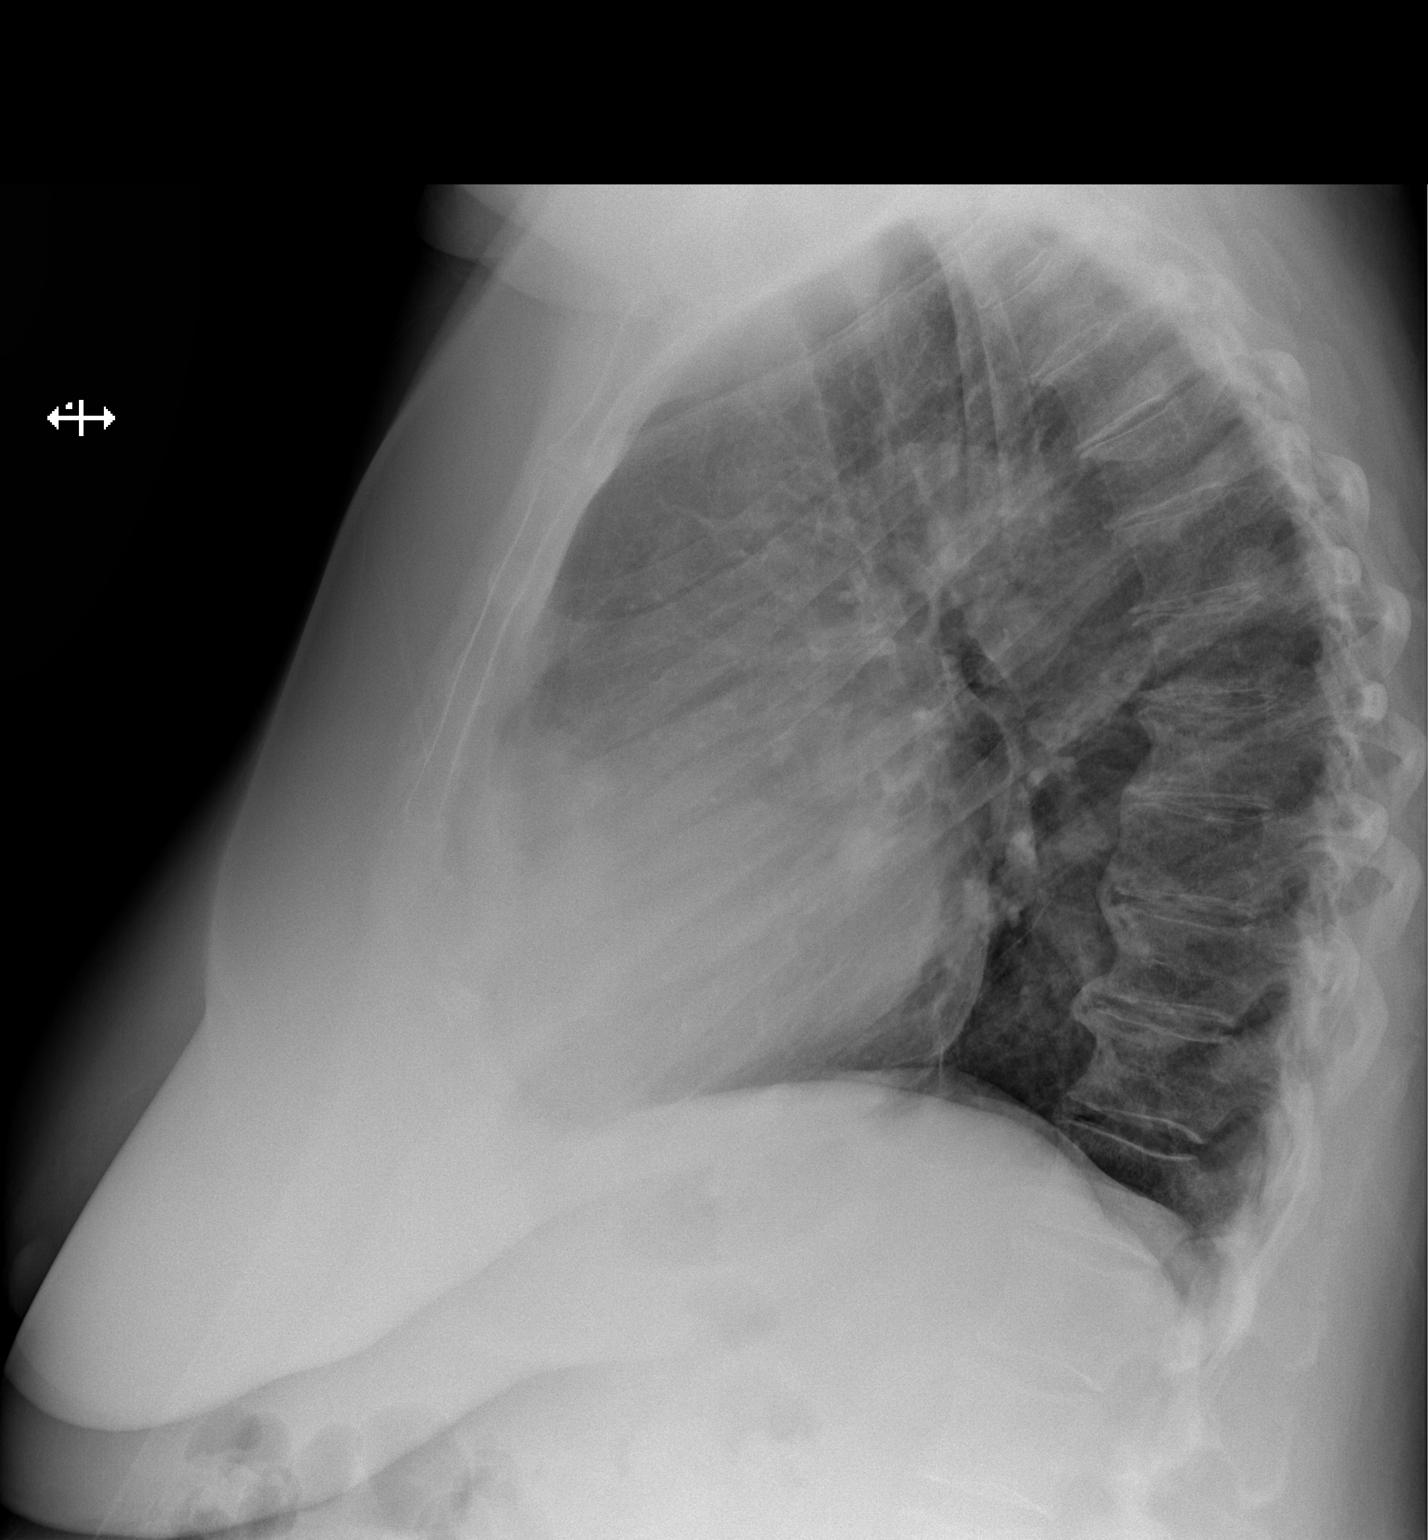

[2 of 2 positions shown; findings below may reference images not displayed]

FINDINGS: The heart size and mediastinal contours are within normal limits.
Both lungs are clear. Mild thoracic spine degenerative changes noted
.
IMPRESSION: No active cardiopulmonary disease.

## 2020-02-04 DIAGNOSIS — N644 Mastodynia: Secondary | ICD-10-CM | POA: Diagnosis not present

## 2020-02-04 DIAGNOSIS — N6310 Unspecified lump in the right breast, unspecified quadrant: Secondary | ICD-10-CM | POA: Diagnosis not present

## 2020-02-16 DIAGNOSIS — H5213 Myopia, bilateral: Secondary | ICD-10-CM | POA: Diagnosis not present

## 2020-02-16 DIAGNOSIS — H2513 Age-related nuclear cataract, bilateral: Secondary | ICD-10-CM | POA: Diagnosis not present

## 2020-02-16 DIAGNOSIS — H40013 Open angle with borderline findings, low risk, bilateral: Secondary | ICD-10-CM | POA: Diagnosis not present

## 2020-02-16 DIAGNOSIS — H524 Presbyopia: Secondary | ICD-10-CM | POA: Diagnosis not present

## 2020-02-16 DIAGNOSIS — H52223 Regular astigmatism, bilateral: Secondary | ICD-10-CM | POA: Diagnosis not present

## 2020-08-17 DIAGNOSIS — E785 Hyperlipidemia, unspecified: Secondary | ICD-10-CM | POA: Diagnosis not present

## 2020-08-17 DIAGNOSIS — Z0000 Encounter for general adult medical examination without abnormal findings: Secondary | ICD-10-CM | POA: Diagnosis not present

## 2020-08-17 DIAGNOSIS — Z23 Encounter for immunization: Secondary | ICD-10-CM | POA: Diagnosis not present

## 2020-08-17 DIAGNOSIS — I10 Essential (primary) hypertension: Secondary | ICD-10-CM | POA: Diagnosis not present
